# Patient Record
Sex: Male | Born: 1945 | Race: White | Hispanic: No | Marital: Married | Smoking: Never smoker
Health system: Southern US, Community
[De-identification: ages and names within clinical notes are randomized; demographics above are authoritative.]

## PROBLEM LIST (undated history)

## (undated) DIAGNOSIS — G4733 Obstructive sleep apnea (adult) (pediatric): Secondary | ICD-10-CM

## (undated) DIAGNOSIS — K219 Gastro-esophageal reflux disease without esophagitis: Secondary | ICD-10-CM

## (undated) DIAGNOSIS — I10 Essential (primary) hypertension: Secondary | ICD-10-CM

## (undated) DIAGNOSIS — E785 Hyperlipidemia, unspecified: Secondary | ICD-10-CM

## (undated) HISTORY — DX: Hyperlipidemia, unspecified: E78.5

## (undated) HISTORY — DX: Essential (primary) hypertension: I10

## (undated) HISTORY — PX: PROSTATE BIOPSY: SHX241

## (undated) HISTORY — DX: Obstructive sleep apnea (adult) (pediatric): G47.33

## (undated) HISTORY — PX: TONSILLECTOMY: SUR1361

---

## 2001-10-22 ENCOUNTER — Ambulatory Visit (HOSPITAL_COMMUNITY): Admission: RE | Admit: 2001-10-22 | Discharge: 2001-10-22 | Payer: Self-pay | Admitting: *Deleted

## 2007-02-26 ENCOUNTER — Ambulatory Visit: Payer: Self-pay | Admitting: Internal Medicine

## 2007-02-26 ENCOUNTER — Encounter: Admission: RE | Admit: 2007-02-26 | Discharge: 2007-02-26 | Payer: Self-pay | Admitting: Internal Medicine

## 2007-02-26 DIAGNOSIS — G4733 Obstructive sleep apnea (adult) (pediatric): Secondary | ICD-10-CM | POA: Insufficient documentation

## 2007-02-26 IMAGING — CR DG FOOT COMPLETE 3+V*R*
3 series · 3 of 3 positions shown · non-contrast
Comparison: none

CLINICAL DATA: Pain and numbness across third, fourth, and fifth metatarsals and toes.  No injury.  Patient is diabetic.
 RIGHT FOOT ? 3 VIEW:
 No acute abnormality is seen.  Alignment is normal.  No erosive process is noted.

[t foot ap right]
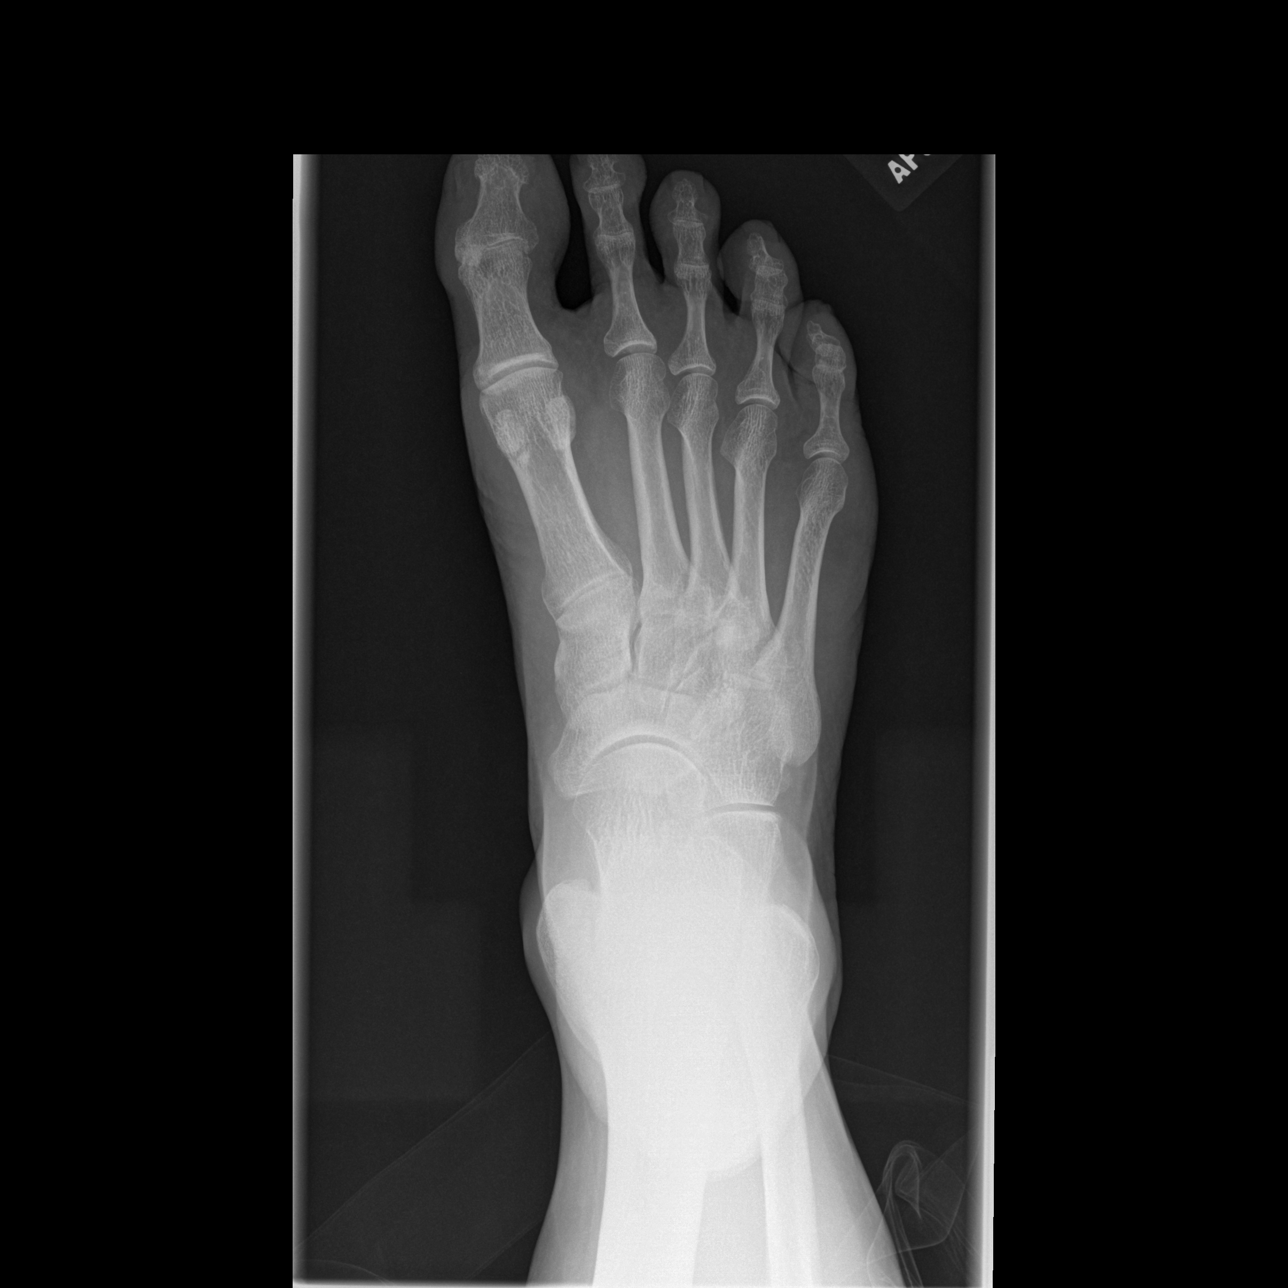

[t foot oblique right]
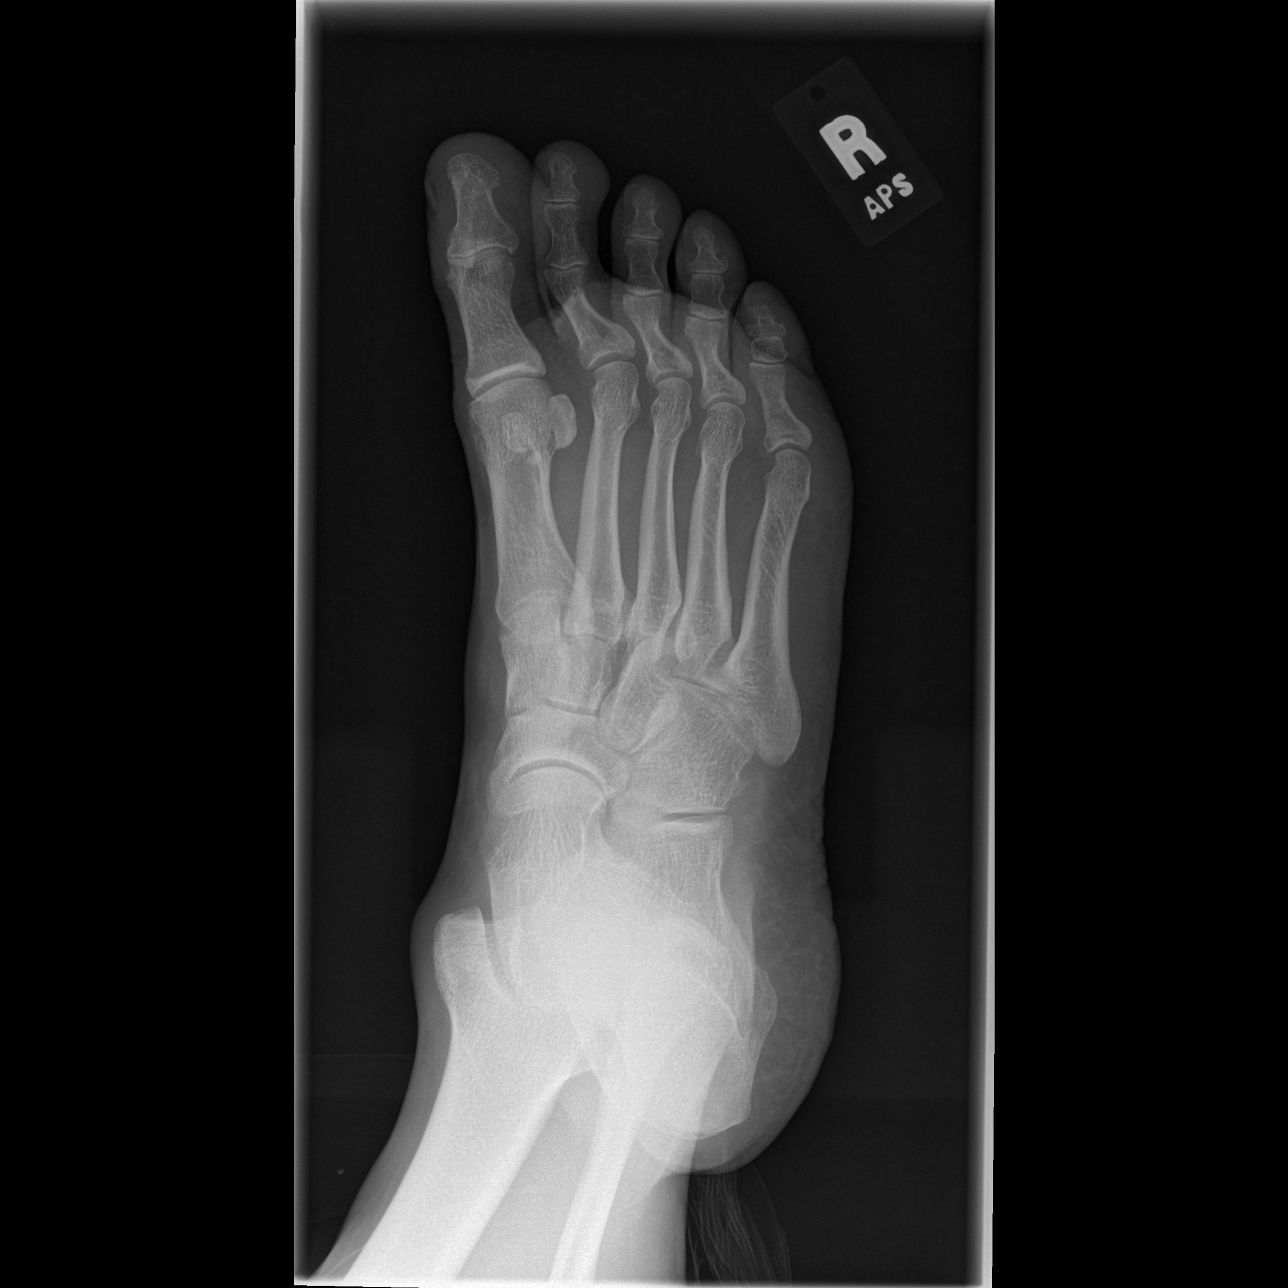

[t foot lat right]
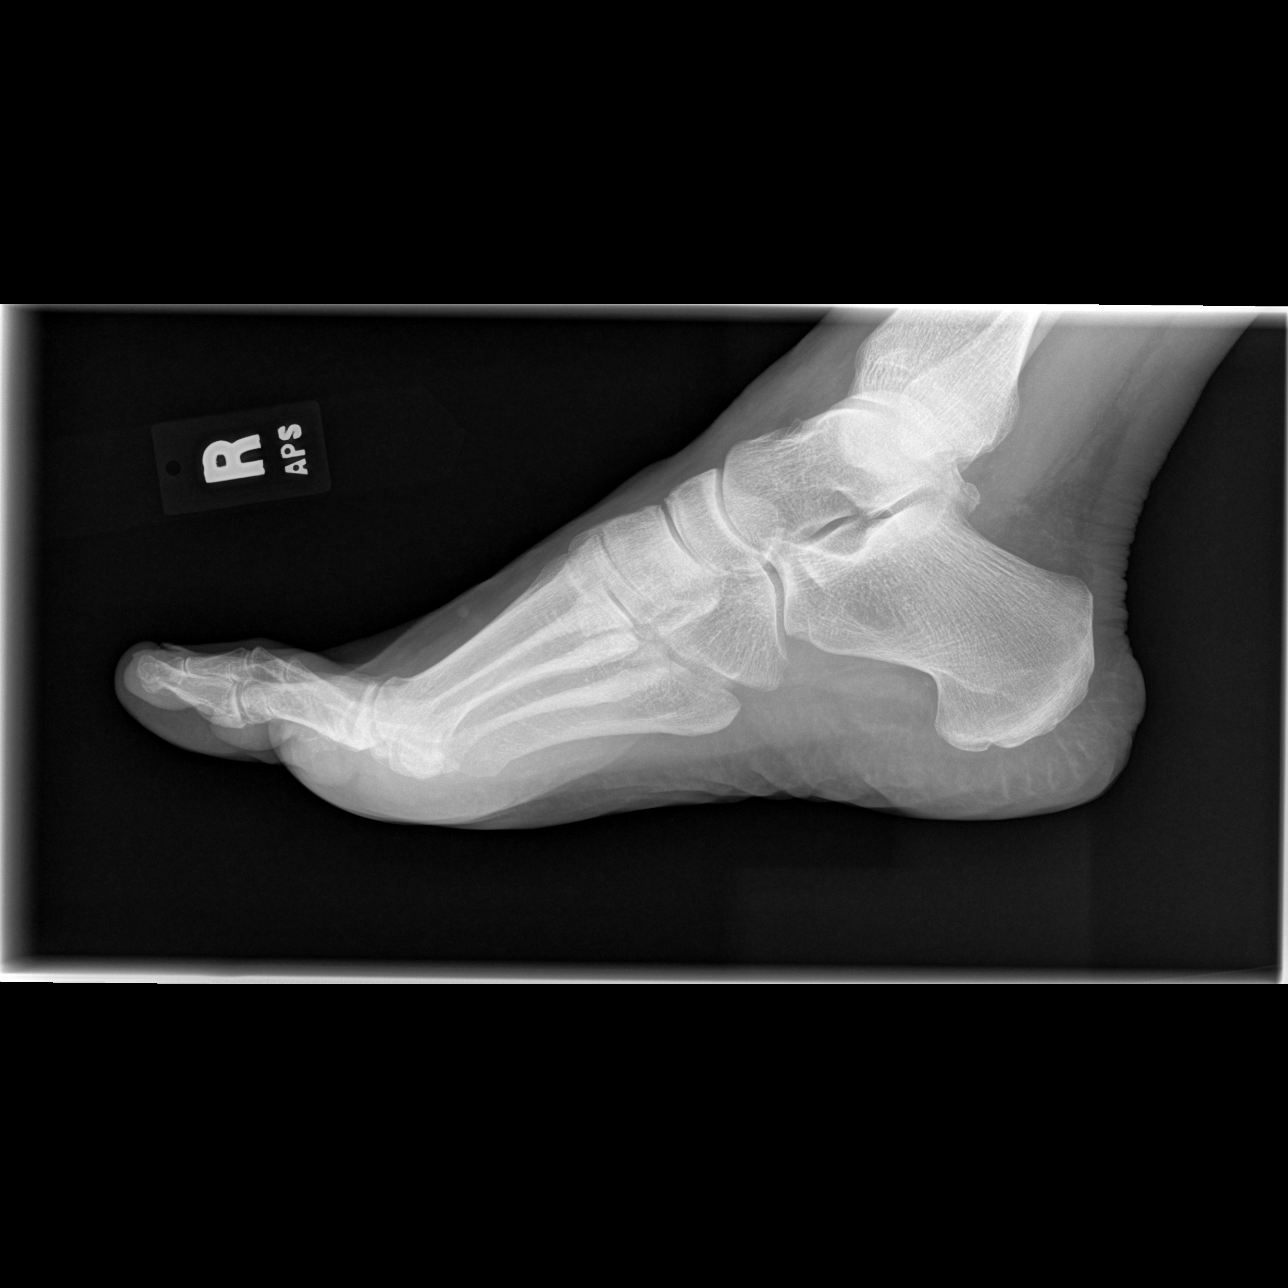

[3 of 3 positions shown; findings below may reference images not displayed]

IMPRESSION: Negative.

## 2007-03-28 ENCOUNTER — Encounter: Payer: Self-pay | Admitting: Internal Medicine

## 2007-03-28 ENCOUNTER — Ambulatory Visit (HOSPITAL_BASED_OUTPATIENT_CLINIC_OR_DEPARTMENT_OTHER): Admission: RE | Admit: 2007-03-28 | Discharge: 2007-03-28 | Payer: Self-pay | Admitting: Internal Medicine

## 2007-04-04 ENCOUNTER — Ambulatory Visit: Payer: Self-pay | Admitting: Internal Medicine

## 2007-04-15 ENCOUNTER — Ambulatory Visit: Payer: Self-pay | Admitting: Internal Medicine

## 2007-04-18 DIAGNOSIS — G4730 Sleep apnea, unspecified: Secondary | ICD-10-CM | POA: Insufficient documentation

## 2007-06-14 ENCOUNTER — Ambulatory Visit: Payer: Self-pay | Admitting: Internal Medicine

## 2007-06-15 DIAGNOSIS — J302 Other seasonal allergic rhinitis: Secondary | ICD-10-CM | POA: Insufficient documentation

## 2008-06-27 ENCOUNTER — Ambulatory Visit: Payer: Self-pay | Admitting: Internal Medicine

## 2008-07-06 ENCOUNTER — Telehealth (INDEPENDENT_AMBULATORY_CARE_PROVIDER_SITE_OTHER): Payer: Self-pay | Admitting: *Deleted

## 2008-07-12 ENCOUNTER — Telehealth (INDEPENDENT_AMBULATORY_CARE_PROVIDER_SITE_OTHER): Payer: Self-pay | Admitting: *Deleted

## 2008-07-13 ENCOUNTER — Encounter: Payer: Self-pay | Admitting: Internal Medicine

## 2009-07-23 ENCOUNTER — Ambulatory Visit: Payer: Self-pay | Admitting: Internal Medicine

## 2010-03-19 NOTE — Assessment & Plan Note (Signed)
Summary: 2 months/apc   PCP:   Zenaida Deed  Chief Complaint:  2 month follow up.  History of Present Illness: Current Problems:  SLEEP APNEA (ICD-780.57) HYPERSOMNIA, ASSOCIATED WITH SLEEP APNEA (ICD-780.53)   Mr. Durene Cal returns for follow-up of his sleep apnea.  He has gotten comfortable with CPAP now using it every night.  Pressure set at 15 CVP.  He is having some mild seasonal rhinitis  which he treats with Claritin during the daytime, and Benadryl at night.  This has also worked well.  He requests no changes and indicates he is sleeping better.     Past Medical History:    Reviewed history from 04/15/2007 and no changes required:       Tonsils out       Hyperliidemia       Sleep Apnea NPSG 03/28/07  AHI 38.1, Epworth 9/24, wt 248lbs, cpap 15   Family History:    Reviewed history from 02/26/2007 and no changes required:       Father snored loudly not dx'd osa- died CVA, smoker  Social History:    Reviewed history from 02/26/2007 and no changes required:       Patient never smoked.        Attorney and newly elected [name]    Review of Systems      See HPI   Vital Signs:  Patient Profile:   65 Years Old Male Weight:      249.25 pounds O2 Sat:      96 % O2 treatment:    Room Air Pulse rate:   72 / minute BP sitting:   126 / 72  (left arm) Cuff size:   large  Vitals Entered By: Reynaldo Minium CMA (June 14, 2007 10:32 AM)             Comments Medications reviewed with patient  ..................................................................Marland KitchenReynaldo Minium CMA  June 14, 2007 10:32 AM      Physical Exam  General:     normal appearance and healthy appearing.   Nose:     mild turbinate edema Neck:     no JVD.   Lungs:     clear bilaterally to auscultation and percussion Heart:     regular rhythm and normal rate.   Cervical Nodes:     no significant adenopathy     Impression & Recommendations:  Problem # 1:  SLEEP APNEA (ICD-780.57) Mr. Durene Cal  has made a good start on his CPAP.  Sleep hygiene is appropriate.  He is comfortable with his machine set at 15CWP.  He does not describe problems staying alert driving back and forth from [place] where he works.  Problem # 2:  ALLERGIC RHINITIS (ICD-477.9) will try sample of Astepro nasal spray for p.r.n. use. His updated medication list for this problem includes:    Claritin 10 Mg Tabs (Loratadine) .Marland Kitchen... 1 by mouth daily   Medications Added to Medication List This Visit: 1)  Cpap 15   Other Orders: Est. Patient Level II (09811)   Patient Instructions: 1)  Please schedule a follow-up appointment in 1 year. 2)  Call if questions or problems 3)  continue cpap at 15 cwp 4)  Consider trial Astepro nasal spray as an alternative to your oral antihistamines, 1-2 puffs, up to twice daily as needed.    ]

## 2010-03-19 NOTE — Assessment & Plan Note (Signed)
Summary: f/u 1 yr///kp   Primary Provider/Referring Provider:   Zenaida Deed  CC:  Follow up compliant with cpap averages 8 hrs per night  and allergy flair over the last 2 weeks.  History of Present Illness: History of Present Illness: 06/14/07-  Mr. Durene Cal returns for follow-up of his sleep apnea.  He has gotten comfortable with CPAP now using it every night.  Pressure set at 15 CwP.  He is having some mild seasonal rhinitis  which he treats with Claritin during the daytime, and Benadryl at night.  This has also worked well.  He requests no changes and indicates he is sleeping better.  06/27/08- OSA with hypersomnia, allergic rhinitis  Great compliance with cpap- used almost every night. Better rested and without snoring. Nasal mask. Still alot of fatigue. Trying to eliminate all caffeine on instruction of nutritionalist. Problem with driving home in afternoon sleepy. We emphasized safe driving as his responsibility.  Current Medications (verified):  1)  Crestor 10 Mg  Tabs (Rosuvastatin Calcium) .Marland Kitchen.. 1 By Mouth Daily 2)  Cpap  15 3)  Zyrtec Allergy 10 Mg Tabs (Cetirizine Hcl) .Marland Kitchen.. 1 Once Daily  July 23, 2009- OSA, allergic rhinitis Now living in [place]. Dropped off Nuvigil- not needed. If he gets up and exercises he stays awake through the day and does well. He is satisfied and compliant with CPAP at 15. The only times he has problem with  it is because of nasal congestion. 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    Vital Signs:  Patient profile:   65 year old male Height:      71 inches Weight:      252 pounds BMI:     35.27 O2 Sat:      95 % on Room air Pulse rate:   75 / minute BP sitting:   138 / 80  (left arm)  Vitals Entered By: Renold Genta RCP, LPN (July 24, 7827 2:42 PM)  O2 Flow:  Room air CC: Follow up compliant with cpap averages 8 hrs per night , allergy flair over the last 2 weeks Comments Medications reviewed with patient Renold Genta RCP, LPN  July 24, 5619 2:55 PM    Physical Exam  Additional Exam:  General: A/Ox3; pleasant and cooperative, NAD, brisk responses, deliberative consideration of options SKIN: no rash, lesions NODES: no lymphadenopathy HEENT: [place]/AT, EOM- WNL, Conjuctivae- clear, PERRLA, TM-WNL, Nose- clear, Throat- clear and wnl, Mallampait III- IV NECK: Supple w/ fair ROM, JVD- none, normal carotid impulses w/o bruits Thyroid- normal to palpation CHEST: Clear to P&A HEART: RRR, no m/g/r heard ABDOMEN:  ZOX:WRUE, nl pulses, no edema  NEURO: Grossly intact to observation      Impression & Recommendations:  Problem # 1:  HYPERSOMNIA, ASSOCIATED WITH SLEEP APNEA (ICD-780.53)  Good complance and control with CPAP.  We discussed replacement of equipment, timing and indications.  Problem # 2:  ALLERGIC RHINITIS (ICD-477.9)  Discussion and script for Astepro. Discussed and contrasted Afrin for occasional use. His updated medication list  for this problem includes:    Zyrtec Allergy 10 Mg Tabs (Cetirizine hcl) .Marland Kitchen... 1 once daily    Astepro 0.15 % Soln (Azelastine hcl) .Marland Kitchen... 1-2 sprays each nostril three times a day as needed  Medications Added to Medication List This Visit: 1)  Astepro 0.15 % Soln (Azelastine hcl) .Marland Kitchen.. 1-2 sprays each nostril three times a day as needed  Other Orders: Est. Patient Level III (45409)  Patient Instructions: 1)  Please schedule a follow-up appointment in 1 year. 2)  Sample and script for Astepro; 3)  1-2 sprays each nostril three times a day if needed 4)  For very occasional use, an otc decongestant like Afrin may work well, but need to avoid using it on a sustained regular basis.  5)  Continue CPAP at 15 Prescriptions: ASTEPRO 0.15 % SOLN (AZELASTINE HCL) 1-2 sprays each nostril three times a day as needed  #1 x prn   Entered and Authorized by:   Waymon Budge MD   Signed by:   Waymon Budge MD on 07/23/2009   Method used:   Print then Give to Patient   RxID:   [id number]

## 2010-03-19 NOTE — Assessment & Plan Note (Signed)
Summary: SNORING/ MBW   Visit Type:  Initial Consult PCP:  RonRoberts  Chief Complaint:  Sleep consult.  History of Present Illness: Consult for Dr. Zenaida Deed for c/o snoring    Loud snore, wife complains and  she notes apneas. 10 years ago it resolved with significant weight loss. Nonpositional. EDS at times if quiet. May have to pull over for nap with long drives. Caffeine may help: 2-3 caffeinated drinks/ day HS 8:30-9:00PM, short latency, used melatonin briefly once with benefit. Trying calcium magnesium suggested by friend to help "electrical shocks in legs" at night- doesn't affect wife- no complain from her that he kicks or strikes out. Up 6:30-7AM, feels rested after morning caffeine. Notes bags under eyes. Remote tonsillectomy, normal thyroid       Past Medical History:    Tonsils out    Hyperliidemia   Family History:    Father snored loudly not dx'd osa- died CVA, smoker  Social History:    Patient never smoked.     Attorney and newly elected Judge   Risk Factors:  Tobacco use:  never Alcohol use:  yes    Drinks per day:  <1    Has patient --       Felt need to cut down:  no       Been annoyed by complaints:  no       Felt guilty about drinking:  no       Needed eye opener in the morning:  no   Review of Systems  General      Complains of fatigue and sleep disorder.      Denies weight loss.  ENT      Complains of nasal congestion.  Resp      Complains of sleep disturbances due to breathing.   Vital Signs:  Patient Profile:   65 Years Old Male Weight:      255 pounds O2 Sat:      95 % O2 treatment:    Room Air Pulse rate:   75 / minute BP sitting:   122 / 90  (right arm)  Vitals Entered By: Michel Bickers CMA (February 26, 2007 3:55 PM)                 Physical Exam  General:     well developed, well nourished, in no acute distress, obese. Alert and appropriate  Head:     normocephalic and atraumatic Eyes:     periorbital  edema Nose:     mild turbinate edema Mouth:     Melampatti Class III.   Neck:     no masses, thyromegaly, or abnormal cervical nodes Chest Wall:     no deformities noted Lungs:     clear bilaterally to auscultation and percussion Heart:     regular rate and rhythm, S1, S2 without murmurs, rubs, gallops, or clicks     Impression & Recommendations:  Problem # 1:  HYPERSOMNIA, ASSOCIATED WITH SLEEP APNEA (ICD-780.53) osa for npsg and rtc for f/u Orders: Est. Patient Level IV (16109) schedule split protocol npsg to r/o hypersomnia with sleep apnea   Medications Added to Medication List This Visit: 1)  Crestor 10 Mg Tabs (Rosuvastatin calcium) .Marland Kitchen.. 1 by mouth daily 2)  Claritin 10 Mg Tabs (Loratadine) .Marland Kitchen.. 1 by mouth daily  Other Orders: Sleep Disorder Referral (Sleep Disorder)   Patient Instructions: 1)  Please schedule a follow-up appointment in 2 months. 2)  follow up with Dr Maple Hudson  after sleep study read    ]

## 2010-03-19 NOTE — Progress Notes (Signed)
Summary: LMTCB-reaction to Nuvigil 250  Phone Note Call from Patient Call back at [facility] Phone [phone]   Caller: Patient Call For: young Reason for Call: Talk to Nurse Summary of Call: having trouble w/ Nuvigil, can't sleep, was up for 24 hours straight, couldn't go to sleep, excited thoughts, kind of manicreaction to it.  Had a lot of energy and after a while it wasn't good at all.  He's in [place].  He'll be avaiable on it after 3:00 our time. Initial call taken by: Eugene Gavia,  Jul 06, 2008 2:09 PM  Follow-up for Phone Call        Best we can do is stop Nuvigil and wait for it to clear. This drug is less likely to cause over stimulation than anything else except caffeine. I'm sorry it didn't work . We could try much lower doses- fragments of his current samples, but woud advise this be done at home on weekend. Follow-up by: Waymon Budge MD,  Jul 06, 2008 11:34 PM  Additional Follow-up for Phone Call Additional follow up Details #1::        Attempted to contact pt.  LMOM TCB. Cloyde Reams RN  Jul 07, 2008 9:27 AM   Pt returned call, needs call before 3:45, he'll be on a plane at 3:45 Additional Follow-up by: Eugene Gavia,  Jul 07, 2008 1:25 PM    Additional Follow-up for Phone Call Additional follow up Details #2::    spoke to pt and he wanted to try a lower dose,  per dr young we can call in nuvigil 50mg  1 by mouth once daily  rx called in for pt gave only 10 tabs pt only wanted a few to see how it worked for him Follow-up by: Philipp Deputy CMA,  Jul 07, 2008 2:22 PM  New/Updated Medications: NUVIGIL 50 MG TABS (ARMODAFINIL) 1 by mouth once daily   Prescriptions: NUVIGIL 50 MG TABS (ARMODAFINIL) 1 by mouth once daily  #10 x 0   Entered by:   Philipp Deputy CMA   Authorized by:   Waymon Budge MD   Signed by:   Philipp Deputy CMA on 07/07/2008   Method used:   Telephoned to ...         RxID:   [id number]  [facility] [place], [place] [facility] [address] ph# [phone],  this was per pt's request

## 2010-03-19 NOTE — Medication Information (Signed)
Summary: Tax adviser   Imported By: Valinda Hoar 07/13/2008 08:44:04  _____________________________________________________________________  External Attachment:    Type:   Image     Comment:   External Document

## 2010-03-19 NOTE — Assessment & Plan Note (Signed)
Summary: 2 WEEKS AFTER SLEEP STUDY/APC   Visit Type:  Follow-up PCP:   Zenaida Deed  Chief Complaint:  Pt here for rov to discuss the results of his psg.  No new complaints today.Marland Kitchen  History of Present Illness: Current Problems:  HYPERSOMNIA, ASSOCIATED WITH SLEEP APNEA (ICD-780.53)  Returns for f/u after NPSG confirmed AHI 38.1/hr. Physiology, medical concerns and treatments for sleep apnea hve been reviewed. CPAP had titrated to 15 cwp and he is going to try that. He now livesw and works in [place], but says he prefers to continue coming here for care.        Past Medical History:    Reviewed history from 02/26/2007 and no changes required:       Tonsils out       Hyperliidemia       Sleep Apnea NPSG 03/28/07  AHI 38.1, Epworth 9/24, wt 248lbs, cpap 15   Family History:    Reviewed history from 02/26/2007 and no changes required:       Father snored loudly not dx'd osa- died CVA, smoker  Social History:    Reviewed history from 02/26/2007 and no changes required:       Patient never smoked.        Attorney and newly elected [name]    Review of Systems      See HPI   Vital Signs:  Patient Profile:   65 Years Old Male Weight:      254.13 pounds O2 Sat:      96 % O2 treatment:    Room Air Pulse rate:   77 / minute BP sitting:   138 / 86  (left arm)  Vitals Entered By: Vernie Murders (April 15, 2007 2:48 PM)                 Physical Exam  General:     normal appearance, healthy appearing, and obese.   Head:     normocephalic and atraumatic Eyes:     PERRLA/EOM intact; conjunctiva and sclera clear Ears:     TMs intact and clear with normal canals Nose:     no deformity, discharge, inflammation, or lesions Neck:     no masses, thyromegaly, or abnormal cervical nodesno JVD.   Lungs:     clear bilaterally to auscultation and percussion Heart:     regular rate and rhythm, S1, S2 without murmurs, rubs, gallops, or clicks Neurologic:     Grossly normal  to observation     Impression & Recommendations:  Problem # 1:  HYPERSOMNIA, ASSOCIATED WITH SLEEP APNEA (ICD-780.53) Assessment: Unchanged Appropriate for trial of CPAP as discussed. Orders: Est. Patient Level III (19147) DME Referral (DME)    Patient Instructions: 1)  Please schedule a follow-up appointment in 2 months. 2)  We are starting cpap at 15 cwp    ]

## 2010-03-19 NOTE — Letter (Signed)
Summary: Sleep consultation form  Sleep consultation form   Imported By: Maryln Gottron 03/05/2007 13:56:00  _____________________________________________________________________  External Attachment:    Type:   Image     Comment:   External Document

## 2010-03-19 NOTE — Assessment & Plan Note (Signed)
Summary: follow up/ mbw   Primary Provider/Referring Provider:   Zenaida Deed  CC:  1 year follow up compliant with cpap averages 7 hrs per night .  History of Present Illness: 06/14/07-  [name] returns for follow-up of his sleep apnea.  He has gotten comfortable with CPAP now using it every night.  Pressure set at 15 CwP.  He is having some mild seasonal rhinitis  which he treats with Claritin during the daytime, and Benadryl at night.  This has also worked well.  He requests no changes and indicates he is sleeping better.  06/27/08- OSA with hypersomnia, allergic rhinitis  Great compliance with cpap- used almost every night. Better rested and without snoring. Nasal mask. Still alot of fatigue. Trying to eliminate all caffeine on instruction of nutritionalist. Problem with driving home in afternoon sleepy. We emphasized safe driving as his responsibility.  Current Medications (verified): 1)  Crestor 10 Mg  Tabs (Rosuvastatin Calcium) .Marland Kitchen.. 1 By Mouth Daily 2)  Cpap  15 3)  Zyrtec Allergy 10 Mg Tabs (Cetirizine Hcl) .Marland Kitchen.. 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  Past Medical History:    Reviewed history from 04/15/2007 and no changes required:    Tonsils out    Hyperliidemia    Sleep Apnea NPSG 03/28/07  AHI 38.1,  Epworth 9/24, wt 248lbs, cpap 15  Past Surgical History:    Tonsillectomy  Family History:    Reviewed history from 2007-02-28 and no changes required:       Father snored loudly not dx'd osa- died CVA, smoker  Social History:    Reviewed history from 28-Feb-2007 and no changes required:       Patient never smoked.        Attorney and newly elected [name]  Review of Systems      See HPI       Denies headache, sinus drainage, sneezing chest pain, dyspnea, n/v/d, weight loss, fever, edema.    Vital Signs:  Patient profile:   65 year old male Height:      71 inches Weight:      257 pounds BMI:     35.97 O2 Sat:      94 % Pulse rate:   67 / minute BP sitting:   120 / 74  (left arm)  Vitals Entered By: Renold Genta RCP, LPN (Jun 27, 2008 4:18 PM)  O2 Sat on room air at rest %:  94 CC: 1 year follow up compliant with cpap averages 7 hrs per night  Comments Medications reviewed with patient Renold Genta RCP, LPN  Jun 27, 2008 4:21 PM    Physical Exam  Additional Exam:  General:  A/Ox3; pleasant and cooperative, NAD, brisk responses, deliberative consideration of options SKIN: no rash, lesions NODES: no lymphadenopathy HEENT: [place]/AT, EOM- WNL, Conjuctivae- clear, PERRLA, TM-WNL, Nose- clear, Throat- clear and wnl, Melampatti III NECK: Supple w/ fair ROM, JVD- none, normal carotid impulses w/o bruits Thyroid- normal to palpation CHEST: Clear to P&A HEART: RRR, no m/g/r heard ABDOMEN:  ZOX:WRUE, nl pulses, no edema  NEURO: Grossly intact to observation      Impression & Recommendations:  Problem # 1:  HYPERSOMNIA, ASSOCIATED WITH SLEEP APNEA (ICD-780.53)  Obstructive apnea seems well controlled with excellent cpap compliance, not snoring through cpap per wife, not wakeful at night and good sleep hygiene. Still with residual sleepiness in afternoon. Discussed caffeine and naps. Will try Nuvigil which we discussed in comparison to alternativel like ritalin. Will try  sample and script for Nuvigil. [facility] drug reactions reviewed with him.  Medications Added to Medication List This Visit: 1)  Zyrtec Allergy 10 Mg Tabs (Cetirizine hcl) .Marland Kitchen.. 1 once daily 2)  Nuvigil 250 Mg Tabs (Armodafinil) .Marland Kitchen.. 1 daily as needed  Other Orders: Est. Patient Level III (45409)  Patient Instructions: 1)  Please schedule a follow-up appointment in 1 year. 2)  Try samples and script Nuvigil 250 mg, one daily as needed. Prescriptions: NUVIGIL 250 MG TABS (ARMODAFINIL) 1 daily as needed  #30 x prn   Entered and Authorized by:   Waymon Budge MD   Signed by:   Waymon Budge MD on 06/27/2008   Method used:   Print then Give to Patient   RxID:   [id number]

## 2010-03-19 NOTE — Progress Notes (Signed)
Summary: preauth- nuvigil  Phone Note Call from Patient Call back at Home Phone [phone]   Caller: Patient Call For: young Summary of Call: pt states that [facility] [address] in [place] has faxed [place] "twice" and waiting for a response to request of pre-auth for nuvigil. pt is requesting that a nurse call this [facility] at [phone]. Initial call taken by: Tivis Ringer,  Jul 12, 2008 9:20 AM  Follow-up for Phone Call        Called for PA- med approved x 1 year starting today.  Faxed this back to pharm to make them aware and lmom for pt to be made aware. Follow-up by: Vernie Murders,  Jul 12, 2008 10:10 AM

## 2010-07-02 NOTE — Procedures (Signed)
NAME:  Harold Vargas, Harold Vargas NO.:  [id number]   MEDICAL RECORD NO.:  [id number]          PATIENT TYPE:  OUT   LOCATION:  SLEEP CENTER                 [facility]ACILITY[facility]   PHYSICIAN:  Clinton D. Maple Hudson, MD, FCCP, FACPDATE OF BIRTH:  15-Dec[birth year]1945   DATE OF STUDY:  03/28/2007                            NOCTURNAL POLYSOMNOGRAM   REFERRING PHYSICIAN:  Clinton D. Young, MD, FCCP, FACP   INDICATION FOR STUDY:  Hypersomnia with sleep apnea.   EPWORTH SLEEPINESS SCORE:  9/24, BMI 34.6, weight 248 pounds, height 71  inches, neck 18 inches.   HOME MEDICATIONS:  Charted and reviewed.   SLEEP ARCHITECTURE:  Split study protocol.  During the diagnostic phase,  total sleep time was 118 minutes with sleep efficiency 79.7%.  Stage I  was 11%, stage II 82.6%, stage III absent, REM 6.4% of total sleep time.  Sleep latency was 27.5 minutes.  REM latency 63.5 minutes.  Awake after  sleep onset 2.5 minutes.  Arousal index increased at 52.4.  Bedtime  medication Crestor and Claritin.   RESPIRATORY DATA:  Split study protocol.  Apnea/hypopnea index (AHI)  38.1 obstructive events per hour indicating moderately severe  obstructive sleep apnea/hypopnea syndrome before CPAP.  This included 64  obstructive apneas and 11 hypopneas before CPAP.  Most events were  recorded while sleeping supine.  CPAP was titrated to 15-CWP.  AHI 0.5  per hour.  A large Mirage Quattro mask was used with heated humidifier.   OXYGEN DATA:  Moderately loud snoring with oxygen desaturation to a  nadir of 71% before CPAP.  After CPAP control, oxygen saturation held  94% on room air.   CARDIAC DATA:  Normal sinus rhythm.   MOVEMENT-PARASOMNIA:  No significant movement disturbance.  Bathroom x1.   IMPRESSIONS-RECOMMENDATIONS:  1. Moderately severe obstructive sleep apnea/hypopnea syndrome,      apnea/hypopnea index 38.1 per hour with most event recorded while      sleeping supine, moderately loud snoring with oxygen  desaturation      to a nadir of 71%.  2. Successful CPAP titration to 15-CWP, apnea/hypopnea index 0.5 per      hour.  A large Mirage Quattro mask was used with heated humidifier.      Clinton D. Maple Hudson, MD, [facility], FACP  Diplomate, Biomedical engineer of Sleep Medicine  Electronically Signed     CDY/MEDQ  D:  04/04/2007 09:20:25  T:  04/05/2007 16:15:07  Job:  [id number]

## 2010-07-11 ENCOUNTER — Encounter: Payer: Self-pay | Admitting: Internal Medicine

## 2010-07-22 ENCOUNTER — Ambulatory Visit: Payer: Self-pay | Admitting: Internal Medicine

## 2010-08-29 ENCOUNTER — Ambulatory Visit (INDEPENDENT_AMBULATORY_CARE_PROVIDER_SITE_OTHER): Payer: BC Managed Care – PPO | Admitting: Internal Medicine

## 2010-08-29 ENCOUNTER — Encounter: Payer: Self-pay | Admitting: Internal Medicine

## 2010-08-29 DIAGNOSIS — G4710 Hypersomnia, unspecified: Secondary | ICD-10-CM

## 2010-08-29 DIAGNOSIS — G4730 Sleep apnea, unspecified: Secondary | ICD-10-CM

## 2010-08-29 DIAGNOSIS — J309 Allergic rhinitis, unspecified: Secondary | ICD-10-CM

## 2010-08-29 NOTE — Progress Notes (Signed)
  Subjective:    Patient ID: Harold Vargas, male    DOB: 06/09/1945, 65 y.o.   MRN: [id number]  HPI 08/29/10- 65 yoM followed for OSA, hypersomnia, complicated by allergic rhinitis Last here July 23, 2009- note reviewed He feels he is doing well and doing well with CPAP, sleeping 8-10 hours every night with an occasional nap. Nasal mask. He has question about replacing parts of the mask. Still some drowsy in afternoons 2-4 PM. Going back to school and concerned about being able to stay awake.Leona Carry gave a boost so he is thinking about restarting it, but wanted to discuss it with me  because of package information cautions in context of OSA . Nuvigil 250 overstimulated him before. Caffeine didn't help. Iced tea gives watery nose.   Review of Systems Constitutional:   No-   weight loss, night sweats, fevers, chills, fatigue, lassitude. HEENT:   No-   headaches, difficulty swallowing, tooth/dental problems, sore throat,                  No-   sneezing, itching, ear ache, nasal congestion, post nasal drip,   CV:  No-   chest pain, orthopnea, PND, swelling in lower extremities, anasarca, dizziness, palpitations  GI:  No-   heartburn, indigestion, abdominal pain, nausea, vomiting, diarrhea,                 change in bowel habits, loss of appetite  Resp: No-   shortness of breath with exertion or at rest.  No-  excess mucus,             No-   productive cough,  No non-productive cough,  No-  coughing up of blood.              No-   change in color of mucus.  No- wheezing.    Skin: No-   rash or lesions.  GU: No-   dysuria, change in color of urine, no urgency or frequency.  No- flank pain.  MS:  No-   joint pain or swelling.  No- decreased range of motion.  No- back pain.  Psych:  No- change in mood or affect. No depression or anxiety.  No memory loss.      Objective:   Physical Exam General- Alert, Oriented, Affect-appropriate, Distress- none acute  Overweight, intelligent man  Skin-  rash-none, lesions- none, excoriation- none  Lymphadenopathy- none  Head- atraumatic  Eyes- Gross vision intact, PERRLA, conjunctivae clear secretions  Ears- Hearing, canals, Tm- normal  Nose- Clear, No- Septal dev, mucus, polyps, erosion, perforation   Throat- Mallampati IV , mucosa clear , drainage- none, tonsils- atrophic  Neck- flexible , trachea midline, no stridor , thyroid nl, carotid no bruit  Chest - symmetrical excursion , unlabored     Heart/CV- RRR , no murmur , no gallop  , no rub, nl s1 s2                     - JVD- none , edema- none, stasis changes- none, varices- none     Lung- clear to P&A, wheeze- none, cough- none , dullness-none, rub- none     Chest wall-   Abd- tender-no, distended-no, bowel sounds-present, HSM- no  Br/ Gen/ Rectal- Not done, not indicated  Extrem- cyanosis- none, clubbing, none, atrophy- none, strength- nl  Neuro- grossly intact to observation         Assessment & Plan:

## 2010-08-29 NOTE — Patient Instructions (Signed)
Try [employer] as a source of mask replacement parts.  Try allegra/ fexofenadine as a potentially less sedating antihistamine than Zyrtec  Continue CPAP at 15  Your Androgel should not be a problem.

## 2010-08-29 NOTE — Assessment & Plan Note (Signed)
He may be experiencing some vasomotor rhinitis. We can try ipratropium later if needed. I suggested he try allegra as less sedating than the zyrtec he has been using.

## 2010-08-29 NOTE — Assessment & Plan Note (Addendum)
Good CPAP compliance and control at 15. We discussed resources for mask components. DME is in [place] He may need to try low dose Nuvigil, but ok to try his Androgel first since he feels it gives him more alertness.

## 2011-08-29 ENCOUNTER — Ambulatory Visit: Payer: BC Managed Care – PPO | Admitting: Internal Medicine

## 2011-10-07 ENCOUNTER — Encounter: Payer: Self-pay | Admitting: Internal Medicine

## 2011-10-07 ENCOUNTER — Ambulatory Visit (INDEPENDENT_AMBULATORY_CARE_PROVIDER_SITE_OTHER): Payer: BC Managed Care – PPO | Admitting: Internal Medicine

## 2011-10-07 VITALS — BP 132/88 | HR 79 | Ht 71.0 in | Wt 256.0 lb

## 2011-10-07 DIAGNOSIS — G4710 Hypersomnia, unspecified: Secondary | ICD-10-CM

## 2011-10-07 DIAGNOSIS — J309 Allergic rhinitis, unspecified: Secondary | ICD-10-CM

## 2011-10-07 DIAGNOSIS — G4730 Sleep apnea, unspecified: Secondary | ICD-10-CM

## 2011-10-07 NOTE — Progress Notes (Signed)
Subjective:    Patient ID: Harold Vargas, male    DOB: 1945/03/31, 66 y.o.   MRN: [id number]  HPI 08/29/10- 65 yoM followed for OSA, hypersomnia, complicated by allergic rhinitis Last here July 23, 2009- note reviewed He feels he is doing well and doing well with CPAP, sleeping 8-10 hours every night with an occasional nap. Nasal mask. He has question about replacing parts of the mask. Still some drowsy in afternoons 2-4 PM. Going back to school and concerned about being able to stay awake.Leona Carry gave a boost so he is thinking about restarting it, but wanted to discuss it with me  because of package information cautions in context of OSA . Nuvigil 250 overstimulated him before. Caffeine didn't help. Iced tea gives watery nose.   10/07/11- 65 yoM followed for OSA, hypersomnia, complicated by allergic rhinitis Wears CPAP every night unless having a head cold; wears approx 6 or more hours; pressure working well for patient. Uses a DME provider in [place]. Still travels back to [facility] for other medical care. Says alertness okay. Working on his dissertation for PhD. 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 Wants to try Provent nasal valves as an alternative to CPAP. We discussed this. Had prolonged nasal drip after upper respiratory infection. Nasal steroid spray and saline rinse worked. 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  ROS-see HPI Constitutional:   No-   weight loss, night sweats, fevers, chills, no-fatigue, lassitude. HEENT:   No-  headaches, difficulty swallowing, tooth/dental problems, sore throat,       No-  sneezing, itching, ear ache, nasal congestion, post nasal drip,  CV:  No-   chest pain, orthopnea, PND, swelling in lower extremities, anasarca,  dizziness, palpitations Resp: No-   shortness of breath with exertion or at rest.              No-   productive cough,  No non-productive cough,  No- coughing up of blood.              No-   change in color of mucus.  No-  wheezing.   Skin: No-   rash or lesions. GI:  No-   heartburn, indigestion, abdominal pain, nausea, vomiting,  GU:  MS:  No-   joint pain or swelling.  . Neuro-     nothing unusual Psych:  No- change in mood or affect. No depression or anxiety.  No memory loss.  OBJ- Physical Exam General- Alert, Oriented, Affect-appropriate, Distress- none acute, overweight Skin- rash-none, lesions- none, excoriation- none Lymphadenopathy- none Head- atraumatic            Eyes- Gross vision intact, PERRLA, conjunctivae and secretions clear            Ears- Hearing, canals-normal            Nose- + mild turbinate edema, no-Septal dev, mucus, polyps, erosion, perforation             Throat- Mallampati II , mucosa clear , drainage- none, tonsils- atrophic Neck- flexible , trachea midline, no stridor , thyroid nl, carotid no bruit Chest - symmetrical excursion , unlabored           Heart/CV- RRR , no murmur , no gallop  , no rub, nl s1 s2                           - JVD- none , edema- none, stasis changes- none, varices- none  Lung- clear to P&A, wheeze- none, cough+ light , dullness-none, rub- none           Chest wall-  Abd-  Br/ Gen/ Rectal- Not done, not indicated Extrem- cyanosis- none, clubbing, none, atrophy- none, strength- nl Neuro- grossly intact to observation  Assessment & Plan:

## 2011-10-07 NOTE — Patient Instructions (Addendum)
Script given for Provent nasal valves to try for sleep apnea when CPAP not available.  Continue CPAP 15 as your primary treatment  Please call as needed

## 2011-10-14 NOTE — Assessment & Plan Note (Signed)
Good compliance and control with CPAP. Weight loss would help. I gave him handwritten prescription for Provent nasal valves and will be interested in his experience.

## 2011-10-14 NOTE — Assessment & Plan Note (Signed)
Delayed clearing after what sounds like an upper respiratory viral infection. Otherwise he has done well with his nasal steroid spray and saline rinse.

## 2011-12-04 ENCOUNTER — Emergency Department (HOSPITAL_COMMUNITY)
Admission: EM | Admit: 2011-12-04 | Discharge: 2011-12-04 | Discharge status: Against Medical Advice | Payer: BC Managed Care – PPO | Source: Home / Self Care | Attending: Emergency Medicine | Admitting: Emergency Medicine

## 2011-12-04 ENCOUNTER — Encounter (HOSPITAL_COMMUNITY): Payer: Self-pay | Admitting: Emergency Medicine

## 2011-12-04 NOTE — ED Notes (Signed)
Pt left without being treated. Mw,cma

## 2011-12-04 NOTE — ED Notes (Signed)
Pt c/o bruise on left foot that appeared this a.m. Pt denies injury and pain.

## 2012-06-07 ENCOUNTER — Encounter: Payer: Self-pay | Admitting: *Deleted

## 2012-10-07 ENCOUNTER — Ambulatory Visit: Payer: BC Managed Care – PPO | Admitting: Internal Medicine

## 2012-10-11 ENCOUNTER — Encounter: Payer: Self-pay | Admitting: Internal Medicine

## 2012-10-11 ENCOUNTER — Ambulatory Visit (INDEPENDENT_AMBULATORY_CARE_PROVIDER_SITE_OTHER): Payer: BC Managed Care – PPO | Admitting: Internal Medicine

## 2012-10-11 VITALS — BP 120/70 | HR 82 | Ht 71.0 in | Wt 255.8 lb

## 2012-10-11 DIAGNOSIS — G4730 Sleep apnea, unspecified: Secondary | ICD-10-CM

## 2012-10-11 DIAGNOSIS — G4710 Hypersomnia, unspecified: Secondary | ICD-10-CM

## 2012-10-11 DIAGNOSIS — G4733 Obstructive sleep apnea (adult) (pediatric): Secondary | ICD-10-CM

## 2012-10-11 MED ORDER — ARMODAFINIL 50 MG PO TABS: ORAL_TABLET | ORAL | Status: DC

## 2012-10-11 MED ORDER — ARMODAFINIL 150 MG PO TABS: 150.0000 mg | ORAL_TABLET | Freq: Every day | ORAL | Status: DC

## 2012-10-11 NOTE — Patient Instructions (Addendum)
Sample Nuvigil 150     1/2- 1 in morning as needed  Script for Nuvigil 50 mg. The adult dose is usually is 150- 250 mg daily, so you can use up to that much as needed  Script printed- DME Replacement CPAP machine 15 cwp, humidiifer, mask of choice, supplies, dx OSA     To carry to the DME in [facility]  Please call as needed

## 2012-10-11 NOTE — Progress Notes (Signed)
Subjective:    Patient ID: Harold Vargas, male    DOB: 23-Jul-1945, 67 y.o.   MRN: [id number]  HPI 08/29/10- 65 yoM followed for OSA, hypersomnia, complicated by allergic rhinitis Last here July 23, 2009- note reviewed He feels he is doing well and doing well with CPAP, sleeping 8-10 hours every night with an occasional nap. Nasal mask. He has question about replacing parts of the mask. Still some drowsy in afternoons 2-4 PM. Going back to school and concerned about being able to stay awake.Leona Carry gave a boost so he is thinking about restarting it, but wanted to discuss it with me  because of package information cautions in context of OSA . Nuvigil 250 overstimulated him before. Caffeine didn't help. Iced tea gives watery nose.   10/07/11- 65 yoM followed for OSA, hypersomnia, complicated by allergic rhinitis Wears CPAP every night unless having a head cold; wears approx 6 or more hours; pressure working well for patient. Uses a DME provider in [place]. Still travels back to [facility] for other medical care. Says alertness okay. Working on his dissertation for PhD. 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 Wants to try Provent nasal valves as an alternative to CPAP. We discussed this. Had prolonged nasal drip after upper respiratory infection. Nasal steroid spray and saline rinse worked. 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 Drives over from [facility] for: Sleep pattern has changed - Waking up earlier - Going to bed earlier - Cpap sometimes cut short due to nasal congestion - increased tiredness in afternoons and diff concentrating 250 mg Nuvigil was too strong but he would like to be able to break a 150 mg Nuvigi if needed. He has a driver, so will sleep in his car if he needs to. CPAP 15 is comfortable/DME company in [place]. Old machine is getting noisy and bothers his  wife.  ROS-see HPI Constitutional:   No-   weight loss, night sweats, fevers, chills, +fatigue, no-lassitude. HEENT:   No-  headaches, difficulty swallowing, tooth/dental problems, sore throat,       No-  sneezing, itching, ear ache, nasal congestion, post nasal drip,  CV:  No-   chest pain, orthopnea, PND, swelling in lower extremities, anasarca,  dizziness, palpitations Resp: No-   shortness of breath with exertion or at rest.              No-   productive cough,  No non-productive cough,  No- coughing up of blood.              No-   change in color of mucus.  No- wheezing.   Skin: No-   rash or lesions. GI:  No-   heartburn, indigestion, abdominal pain, nausea, vomiting,  GU:  MS:  No-   joint pain or swelling.  . Neuro-     nothing unusual Psych:  No- change in mood or affect. No depression or anxiety.  No memory loss.  OBJ- Physical Exam General- Alert, Oriented, Affect-appropriate, Distress- none acute, overweight Skin- rash-none, lesions- none, excoriation- none Lymphadenopathy- none Head- atraumatic            Eyes- Gross vision intact, PERRLA, conjunctivae and secretions clear            Ears- Hearing, canals-normal            Nose- + mild turbinate edema, no-Septal dev, mucus, polyps, erosion, perforation  Throat- Mallampati II , mucosa clear , drainage- none, tonsils- atrophic Neck- flexible , trachea midline, no stridor , thyroid nl, carotid no bruit Chest - symmetrical excursion , unlabored           Heart/CV- RRR , no murmur , no gallop  , no rub, nl s1 s2                           - JVD- none , edema- none, stasis changes- none, varices- none           Lung- clear to P&A, wheeze- none, cough+ light , dullness-none, rub- none           Chest wall-  Abd-  Br/ Gen/ Rectal- Not done, not indicated Extrem- cyanosis- none, clubbing, none, atrophy- none, strength- nl Neuro- grossly intact to observation  Assessment & Plan:

## 2012-10-23 NOTE — Assessment & Plan Note (Signed)
CPAP 15 is comfortable and his compliance is good. Fractions of a Nuvigil pill have helped. Plan-try providing Nuvigil 50 mg. Look into replacing worn out machine.

## 2013-10-11 ENCOUNTER — Ambulatory Visit (INDEPENDENT_AMBULATORY_CARE_PROVIDER_SITE_OTHER): Payer: BC Managed Care – PPO | Admitting: Internal Medicine

## 2013-10-11 ENCOUNTER — Encounter: Payer: Self-pay | Admitting: Internal Medicine

## 2013-10-11 VITALS — BP 134/76 | HR 71 | Ht 71.0 in | Wt 265.4 lb

## 2013-10-11 DIAGNOSIS — J302 Other seasonal allergic rhinitis: Secondary | ICD-10-CM

## 2013-10-11 DIAGNOSIS — G4733 Obstructive sleep apnea (adult) (pediatric): Secondary | ICD-10-CM

## 2013-10-11 DIAGNOSIS — J309 Allergic rhinitis, unspecified: Secondary | ICD-10-CM

## 2013-10-11 DIAGNOSIS — G4730 Sleep apnea, unspecified: Secondary | ICD-10-CM

## 2013-10-11 DIAGNOSIS — G4710 Hypersomnia, unspecified: Secondary | ICD-10-CM

## 2013-10-11 NOTE — Assessment & Plan Note (Signed)
Adequate control with over-the-counter meds. This is not a peak season.

## 2013-10-11 NOTE — Patient Instructions (Signed)
We will continue CPAP 15/ [facility]/ [place] [phone]  Order- CPAP download pressure compliance documentation  Please call if we can help

## 2013-10-11 NOTE — Assessment & Plan Note (Signed)
He looks well and indicates good compliance and control. CPAP has helped his quality of life and he is satisfied to continue. Plan-download for pressure and compliance check

## 2013-10-11 NOTE — Progress Notes (Signed)
Subjective:    Patient ID: Harold Vargas, male    DOB: 1945/12/19, 68 y.o.   MRN: [id number]  HPI 08/29/10- 65 yoM followed for OSA, hypersomnia, complicated by allergic rhinitis Last here July 23, 2009- note reviewed He feels he is doing well and doing well with CPAP, sleeping 8-10 hours every night with an occasional nap. Nasal mask. He has question about replacing parts of the mask. Still some drowsy in afternoons 2-4 PM. Going back to school and concerned about being able to stay awake.Leona Carry gave a boost so he is thinking about restarting it, but wanted to discuss it with me  because of package information cautions in context of OSA . Nuvigil 250 overstimulated him before. Caffeine didn't help. Iced tea gives watery nose.   10/07/11- 65 yoM followed for OSA, hypersomnia, complicated by allergic rhinitis Wears CPAP every night unless having a head cold; wears approx 6 or more hours; pressure working well for patient. Uses a DME provider in [place]. Still travels back to [facility] for other medical care. Says alertness okay. Working on his dissertation for PhD. 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 Wants to try Provent nasal valves as an alternative to CPAP. We discussed this. Had prolonged nasal drip after upper respiratory infection. Nasal steroid spray and saline rinse worked. 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 Drives over from [facility] for: Sleep pattern has changed - Waking up earlier - Going to bed earlier - Cpap sometimes cut short due to nasal congestion - increased tiredness in afternoons and diff concentrating 250 mg Nuvigil was too strong but he would like to be able to break a 150 mg Nuvigi if needed. He has a driver, so will sleep in his car if he needs to. CPAP 15 is comfortable/DME company in [place]. Old machine is getting noisy and bothers his  wife.  10/11/13-67 yoM [name] followed for OSA, hypersomnia, complicated by allergic rhinitis Now Nutritional therapist. Drives over from [facility] FOLLOWS FOR:  Using cpap 15/ [facility] in [place] for 8+ hours per night. He is very comfortable with this and has no concerns, "works fine" since filter changed. He is no longer sleepy in the daytime and not using Provigil.  ROS-see HPI Constitutional:   No-   weight loss, night sweats, fevers, chills, fatigue, no-lassitude. HEENT:   No-  headaches, difficulty swallowing, tooth/dental problems, sore throat,       No-  sneezing, itching, ear ache, nasal congestion, post nasal drip,  CV:  No-   chest pain, orthopnea, PND, swelling in lower extremities, anasarca,  dizziness, palpitations Resp: No-   shortness of breath with exertion or at rest.              No-   productive cough,  No non-productive cough,  No- coughing up of blood.              No-   change in color of mucus.  No- wheezing.   Skin: No-   rash or lesions. GI:  No-   heartburn, indigestion, abdominal pain, nausea, vomiting,  GU:  MS:  No-   joint pain or swelling.  . Neuro-     nothing unusual Psych:  No- change in mood or affect. No depression or anxiety.  No memory loss.  OBJ- Physical Exam General- Alert, Oriented, Affect-appropriate, Distress- none acute, overweight Skin- rash-none, lesions- none,  excoriation- none Lymphadenopathy- none Head- atraumatic            Eyes- Gross vision intact, PERRLA, conjunctivae and secretions clear            Ears- Hearing, canals-normal            Nose- + mild turbinate edema, no-Septal dev, mucus, polyps, erosion, perforation             Throat- Mallampati II , mucosa clear , drainage- none, tonsils- atrophic Neck- flexible , trachea midline, no stridor , thyroid nl, carotid no bruit Chest - symmetrical excursion , unlabored           Heart/CV- RRR , no murmur , no gallop  , no rub, nl s1 s2                           - JVD-  none , edema- none, stasis changes- none, varices- none           Lung- clear to P&A, wheeze- none, cough+ light , dullness-none, rub- none           Chest wall-  Abd-  Br/ Gen/ Rectal- Not done, not indicated Extrem- cyanosis- none, clubbing, none, atrophy- none, strength- nl Neuro- grossly intact to observation  Assessment & Plan:

## 2014-09-25 ENCOUNTER — Ambulatory Visit (INDEPENDENT_AMBULATORY_CARE_PROVIDER_SITE_OTHER): Payer: BC Managed Care – PPO | Admitting: Internal Medicine

## 2014-09-25 ENCOUNTER — Encounter: Payer: Self-pay | Admitting: Internal Medicine

## 2014-09-25 VITALS — BP 106/68 | HR 82 | Ht 71.0 in | Wt 265.0 lb

## 2014-09-25 DIAGNOSIS — G4733 Obstructive sleep apnea (adult) (pediatric): Secondary | ICD-10-CM | POA: Diagnosis not present

## 2014-09-25 DIAGNOSIS — G4710 Hypersomnia, unspecified: Secondary | ICD-10-CM

## 2014-09-25 DIAGNOSIS — G4730 Sleep apnea, unspecified: Secondary | ICD-10-CM

## 2014-09-25 NOTE — Patient Instructions (Signed)
Order- DME [facility] [phone])  Order replacement mask of choice, supplies, download for pressure compliance     Dx OSA  Please call as needed

## 2014-09-25 NOTE — Progress Notes (Signed)
Subjective:    Patient ID: Harold Vargas, male    DOB: May 07, 1945, 69 y.o.   MRN: [id number]  HPI 08/29/10- 65 yoM followed for OSA, hypersomnia, complicated by allergic rhinitis Last here July 23, 2009- note reviewed He feels he is doing well and doing well with CPAP, sleeping 8-10 hours every night with an occasional nap. Nasal mask. He has question about replacing parts of the mask. Still some drowsy in afternoons 2-4 PM. Going back to school and concerned about being able to stay awake.Leona Carry gave a boost so he is thinking about restarting it, but wanted to discuss it with me  because of package information cautions in context of OSA . Nuvigil 250 overstimulated him before. Caffeine didn't help. Iced tea gives watery nose.   10/07/11- 65 yoM followed for OSA, hypersomnia, complicated by allergic rhinitis Wears CPAP every night unless having a head cold; wears approx 6 or more hours; pressure working well for patient. Uses a DME provider in [place]. Still travels back to [facility] for other medical care. Says alertness okay. Working on his dissertation for PhD. 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 Wants to try Provent nasal valves as an alternative to CPAP. We discussed this. Had prolonged nasal drip after upper respiratory infection. Nasal steroid spray and saline rinse worked. 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 Drives over from [facility] for: Sleep pattern has changed - Waking up earlier - Going to bed earlier - Cpap sometimes cut short due to nasal congestion - increased tiredness in afternoons and diff concentrating 250 mg Nuvigil was too strong but he would like to be able to break a 150 mg Nuvigi if needed. He has a driver, so will sleep in his car if he needs to. CPAP 15 is comfortable/DME company in [place]. Old machine is getting noisy and bothers his  wife.  10/11/13-67 yoM [name] followed for OSA, hypersomnia, complicated by allergic rhinitis Now Nutritional therapist. Drives over from [facility] FOLLOWS FOR:  Using cpap 15/ [facility] in [place] for 8+ hours per night. He is very comfortable with this and has no concerns, "works fine" since filter changed. He is no longer sleepy in the daytime and not using Provigil.  09/25/14- 69 yoM [name] followed for OSA, hypersomnia, complicated by allergic rhinitis CPAP 15/ [facility]/ [place] [phone] FOLLOWS FOR: Wears CPAP every night for about 8 hours; pt will need order sent for DL and supplies. Pressure works well for patient.  Morning nasal congestion possibly related to his nasal mask. We discussed portable CPAP devices. Stays very tired but he strongly associates this with use of lisinopril and is going to discuss alternatives for trial with his primary physician.   ROS-see HPI Constitutional:   No-   weight loss, night sweats, fevers, chills,+ fatigue, no-lassitude. HEENT:   No-  headaches, difficulty swallowing, tooth/dental problems, sore throat,       No-  sneezing, itching, ear ache, nasal congestion, post nasal drip,  CV:  No-   chest pain, orthopnea, PND, swelling in lower extremities, anasarca,  dizziness, palpitations Resp: No-   shortness of breath with exertion or at rest.              No-   productive cough,  No non-productive cough,  No- coughing up of blood.  No-   change in color of mucus.  No- wheezing.   Skin: No-   rash or lesions. GI:  No-   heartburn, indigestion, abdominal pain, nausea, vomiting,  GU:  MS:  No-   joint pain or swelling.  . Neuro-     nothing unusual Psych:  No- change in mood or affect. No depression or anxiety.  No memory loss.  OBJ- Physical Exam General- Alert, Oriented, Affect-appropriate, Distress- none acute, +overweight Skin- rash-none, lesions- none, excoriation- none Lymphadenopathy-  none Head- atraumatic            Eyes- Gross vision intact, PERRLA, conjunctivae and secretions clear            Ears- Hearing, canals-normal            Nose- + mild turbinate edema, no-Septal dev, mucus, polyps, erosion, perforation             Throat- Mallampati II-IVI , mucosa clear , drainage- none, tonsils- atrophic Neck- flexible , trachea midline, no stridor , thyroid nl, carotid no bruit Chest - symmetrical excursion , unlabored           Heart/CV- RRR , no murmur , no gallop  , no rub, nl s1 s2                           - JVD- none , edema- none, stasis changes- none, varices- none           Lung- clear to P&A, wheeze- none, cough-none , dullness-none, rub- none           Chest wall-  Abd-  Br/ Gen/ Rectal- Not done, not indicated Extrem- cyanosis- none, clubbing, none, atrophy- none, strength- nl Neuro- grossly intact to observation  Assessment & Plan:

## 2014-10-01 NOTE — Assessment & Plan Note (Signed)
Not clear that his persistent fatigue now is related to his sleep apnea. He describes good compliance and control from CPAP. We need to verify that. He tried Nuvigil in the past but it was too strong for him. We discussed fractional doses but he wants to wait. Plan-attention to mask fit and comfort. He blames his fatigue on lisinopril based on timing, so he will discuss alternatives with his primary physician for trial. Download for documentation. He'll discuss mask leak and comfort issues with DME company.

## 2014-10-10 ENCOUNTER — Ambulatory Visit: Payer: BC Managed Care – PPO | Admitting: Internal Medicine

## 2015-02-18 DIAGNOSIS — E7800 Pure hypercholesterolemia, unspecified: Secondary | ICD-10-CM

## 2015-02-18 DIAGNOSIS — N529 Male erectile dysfunction, unspecified: Secondary | ICD-10-CM

## 2015-02-18 DIAGNOSIS — M1990 Unspecified osteoarthritis, unspecified site: Secondary | ICD-10-CM

## 2015-02-18 DIAGNOSIS — G4733 Obstructive sleep apnea (adult) (pediatric): Secondary | ICD-10-CM

## 2015-02-18 HISTORY — DX: Male erectile dysfunction, unspecified: N52.9

## 2015-02-18 HISTORY — DX: Obstructive sleep apnea (adult) (pediatric): G47.33

## 2015-02-18 HISTORY — DX: Unspecified osteoarthritis, unspecified site: M19.90

## 2015-02-18 HISTORY — DX: Pure hypercholesterolemia, unspecified: E78.00

## 2015-03-09 ENCOUNTER — Telehealth: Payer: Self-pay | Admitting: Internal Medicine

## 2015-03-09 DIAGNOSIS — G4733 Obstructive sleep apnea (adult) (pediatric): Secondary | ICD-10-CM

## 2015-03-09 NOTE — Telephone Encounter (Signed)
Pt calling again  And can be reached @ same #.Caren Griffins

## 2015-03-09 NOTE — Telephone Encounter (Signed)
Spoke with pt. States that he needs an order for a new CPAP mask. Order has been placed. Nothing further was needed.

## 2015-03-09 NOTE — Telephone Encounter (Signed)
lmtcb x1 for pt. 

## 2015-03-09 NOTE — Telephone Encounter (Signed)
Pt returning call and can be reached @ same.Stanley A Dalton ° °

## 2015-03-09 NOTE — Telephone Encounter (Signed)
Patient called and said he had received a call back since last note, can be reached at [phone].

## 2015-03-09 NOTE — Telephone Encounter (Signed)
lmtcb for pt.  

## 2015-03-26 ENCOUNTER — Telehealth: Payer: Self-pay | Admitting: Internal Medicine

## 2015-03-26 DIAGNOSIS — G4733 Obstructive sleep apnea (adult) (pediatric): Secondary | ICD-10-CM

## 2015-03-26 NOTE — Telephone Encounter (Signed)
Spoke with pt. States that he has recently moved to [place] and will need CPAP supplies. Wants an order sent to [facility]. This will be done. Nothing further was needed.

## 2015-09-25 ENCOUNTER — Ambulatory Visit: Payer: BC Managed Care – PPO | Admitting: Internal Medicine

## 2015-09-26 ENCOUNTER — Telehealth: Payer: Self-pay

## 2015-09-26 NOTE — Telephone Encounter (Signed)
lmtcb X1 for pt, asking to bring cpap machine to office visit tomorrow.

## 2015-09-27 ENCOUNTER — Ambulatory Visit (INDEPENDENT_AMBULATORY_CARE_PROVIDER_SITE_OTHER): Payer: BC Managed Care – PPO | Admitting: Internal Medicine

## 2015-09-27 ENCOUNTER — Encounter: Payer: Self-pay | Admitting: Internal Medicine

## 2015-09-27 VITALS — BP 120/80 | HR 73 | Ht 71.0 in | Wt 257.0 lb

## 2015-09-27 DIAGNOSIS — J302 Other seasonal allergic rhinitis: Secondary | ICD-10-CM | POA: Diagnosis not present

## 2015-09-27 DIAGNOSIS — G4730 Sleep apnea, unspecified: Secondary | ICD-10-CM | POA: Diagnosis not present

## 2015-09-27 DIAGNOSIS — G4710 Hypersomnia, unspecified: Secondary | ICD-10-CM | POA: Diagnosis not present

## 2015-09-27 NOTE — Assessment & Plan Note (Signed)
Adequate control with OTC meds as needed.

## 2015-09-27 NOTE — Assessment & Plan Note (Signed)
Utilization download indicates good use. Pressure still at 15 and he is satisfied to remain using online sources for his supplies. He had not wanted to wait for a conference opportunity with local DME but can ask help establishing that contacted any time.

## 2015-09-27 NOTE — Patient Instructions (Signed)
We can continue CPAP 15, mask of chice, humidfier, supplies, AirView   Dx OSA  Ok to continue getting supplies on line.  You can check sources like CPAP.com for options including smaller "travel" CPAP machines  Please call if we can help

## 2015-09-27 NOTE — Progress Notes (Signed)
Subjective:    Patient ID: Harold Vargas, male    DOB: 10-Oct-1945, 70 y.o.   MRN: [id number]  HPI 08/29/10- 65 yoM followed for OSA, hypersomnia, complicated by allergic rhinitis Last here July 23, 2009- note reviewed He feels he is doing well and doing well with CPAP, sleeping 8-10 hours every night with an occasional nap. Nasal mask. He has question about replacing parts of the mask. Still some drowsy in afternoons 2-4 PM. Going back to school and concerned about being able to stay awake.Leona Carry. Androgel gave a boost so he is thinking about restarting it, but wanted to discuss it with me  because of package information cautions in context of OSA . Nuvigil 250 overstimulated him before. Caffeine didn't help. Iced tea gives watery nose.   10/07/11- 65 yoM followed for OSA, hypersomnia, complicated by allergic rhinitis Wears CPAP 15most every night unless having a head cold; wears approx 6 or more hours; pressure working well for patient. [place] provider in [place]. [place]. Says alertness okay. Working on his dissertation for PhD. 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 Wants to try Provent nasal valves as an alternative to CPAP. We discussed this. Had prolonged nasal drip after upper respiratory infection. Nasal steroid spray and saline rinse worked. 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 [place] Follows for: Sleep pattern has changed - Waking up earlier - Going to bed earlier - Cpap sometimes cut short due to nasal congestion - increased tiredness in afternoons and diff concentrating 250 mg Nuvigil was too strong but he would like to be able to break a 150 mg Nuvigi if needed. He has a driver, so will sleep in his car if he needs to. CPAP 15 is comfortable/DME company in [place]. Old machine is getting noisy and bothers his  wife.  10/11/13-67 yoM [name] followed for OSA, hypersomnia, complicated by allergic rhinitis Now Nutritional therapistChief Justice. [place] FOLLOWS FOR:  Using cpap 15/ [facility] in [place] for 8+ hours per night. He is very comfortable with this and has no concerns, "works fine" since filter changed. He is no longer sleepy in the daytime and not using Provigil.  09/25/14- 69 yoM [name] followed for OSA, hypersomnia, complicated by allergic rhinitis CPAP 15/ [facility] [place] [phone] FOLLOWS FOR: Wears CPAP every night for about 8 hours; pt will need order sent for DL and supplies. Pressure works well for patient.  Morning nasal congestion possibly related to his nasal mask. We discussed portable CPAP devices. Stays very tired but he strongly associates this with use of lisinopril and is going to discuss alternatives for trial with his primary physician.  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 DL attached.  He feels he is fine with CPAP 15, current machine and mask. His machine is about 70 years old discussed insurance guidelines for replacement. He would be interested in a smaller travel machine but is going to look online and indicates he is not prepared to start trying replacement yet. Occasional nap. Does not report significant rhinitis symptoms at this time in mid summer. Weight now 257 pounds.  Was 248 pounds when sleep study done in 2009.  ROS-see HPI Constitutional:   No-   weight loss, night sweats, fevers, chills,+ fatigue, no-lassitude. HEENT:   No-  headaches, difficulty swallowing, tooth/dental problems, sore throat,       No-  sneezing, itching, ear ache, nasal congestion,  post nasal drip,  CV:  No-   chest pain, orthopnea, PND, swelling in lower extremities, anasarca,  dizziness, palpitations Resp: No-   shortness of breath with exertion or at rest.              No-   productive cough,  No non-productive cough,  No- coughing up of blood.              No-   change in color of mucus.  No- wheezing.   Skin: No-   rash or lesions. GI:  No-   heartburn, indigestion, abdominal pain, nausea, vomiting,  GU:  MS:  No-   joint pain or swelling.  . Neuro-     nothing unusual Psych:  No- change in mood or affect. No depression or anxiety.  No memory loss.  OBJ- Physical Exam General- Alert, Oriented, Affect-appropriate, Distress- none acute, +overweight Skin- rash-none, lesions- none, excoriation- none Lymphadenopathy- none Head- atraumatic            Eyes- Gross vision intact, PERRLA, conjunctivae and secretions clear            Ears- Hearing, canals-normal            Nose- + mild turbinate edema, no-Septal dev, mucus, polyps, erosion, perforation             Throat- Mallampati II-IVI , mucosa clear , drainage- none, tonsils- atrophic Neck- flexible , trachea midline, no stridor , thyroid nl, carotid no bruit Chest - symmetrical excursion , unlabored           Heart/CV- RRR , no murmur , no gallop  , no rub, nl s1 s2                           - JVD- none , edema- none, stasis changes- none, varices- none           Lung- clear to P&A, wheeze- none, cough-none , dullness-none, rub- none           Chest wall-  Abd-  Br/ Gen/ Rectal- Not done, not indicated Extrem- cyanosis- none, clubbing, none, atrophy- none, strength- nl Neuro- grossly intact to observation  Assessment & Plan:

## 2015-09-27 NOTE — Telephone Encounter (Signed)
Pt cb and was asked to bring cpap machine to ov today, patient verbalized understanding and nothing further is needed

## 2015-10-03 ENCOUNTER — Encounter: Payer: Self-pay | Admitting: Internal Medicine

## 2016-10-08 ENCOUNTER — Ambulatory Visit (INDEPENDENT_AMBULATORY_CARE_PROVIDER_SITE_OTHER): Payer: BC Managed Care – PPO | Admitting: Internal Medicine

## 2016-10-08 ENCOUNTER — Encounter: Payer: Self-pay | Admitting: Internal Medicine

## 2016-10-08 VITALS — BP 122/78 | HR 60 | Ht 71.0 in | Wt 265.0 lb

## 2016-10-08 DIAGNOSIS — J301 Allergic rhinitis due to pollen: Secondary | ICD-10-CM

## 2016-10-08 DIAGNOSIS — G4733 Obstructive sleep apnea (adult) (pediatric): Secondary | ICD-10-CM

## 2016-10-08 DIAGNOSIS — Z23 Encounter for immunization: Secondary | ICD-10-CM | POA: Diagnosis not present

## 2016-10-08 MED ORDER — AMPHETAMINE-DEXTROAMPHETAMINE 5 MG PO TABS: 5.0000 mg | ORAL_TABLET | Freq: Every day | ORAL | 0 refills | Status: DC

## 2016-10-08 NOTE — Progress Notes (Signed)
Subjective:    Patient ID: Harold Vargas, male    DOB: 09/02/1945, 71 y.o.   MRN: [id number]  HPI 71 year old male Harold Vargas followed for OSA, hypersomnia, complicated by allergic rhinitis NPSG 03/28/07- AHI 38.1  Epworth 9/24  Weight then 248 lbs -------------------------------------------------------------------------------------- 09/27/2015-71 year old male Harold Vargas followed for OSA, hypersomnia, complicated by allergic rhinitis CPAP 15/[facility] [phone]--- out of basis FOLLOWS FOR: DME: Pt does not have local company any longer-pt has recently moved back to [place]; pt recently ordered supplies on line. DL attached.  He feels he is fine with CPAP 15, current machine and mask. His machine is about 71 years old discussed insurance guidelines for replacement. He would be interested in a smaller travel machine but is going to look online and indicates he is not prepared to start trying replacement yet. Occasional nap. Does not report significant rhinitis symptoms at this time in mid summer. Weight now 257 pounds. Was 248 pounds when sleep study done in 2009.  10/08/16- 71 year old male Harold Vargas followed for OSA, hypersomnia, complicated by allergic rhinitis CPAP 15/[facility] [phone]  He needs a new machine and plans to get it on line. FOLLOWS FOR: Pt states he longer has DME and gets supplies,etc online. Pt wears CPAP nighlty. DL attached.  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 CPAP machine is old, used every night. Definitely sleeps better, with regular bedtime around 9:30 PM up for 40 5 AM. Evaluation for dull, confusional headaches which affect his work. Naps help some. He had found Provigil 200 mg too strong years ago, not having tried splitting it. Avoids caffeine be on one cup in the morning. Ice tea makes him sneeze. No problems with limb jerks. Not snoring through CPAP. Uses  Nasonex when needed for seasonal rhinitis.  ROS-see HPI  + = positive Constitutional:   No-   weight loss, night sweats, fevers, chills,+ fatigue, no-lassitude. HEENT:   No-  headaches, difficulty swallowing, tooth/dental problems, sore throat,       No-  sneezing, itching, ear ache, nasal congestion, post nasal drip,  CV:  No-   chest pain, orthopnea, PND, swelling in lower extremities, anasarca,  dizziness, palpitations Resp: No-   shortness of breath with exertion or at rest.              No-   productive cough,  No non-productive cough,  No- coughing up of blood.              No-   change in color of mucus.  No- wheezing.   Skin: No-   rash or lesions. GI:  No-   heartburn, indigestion, abdominal pain, nausea, vomiting,  GU:  MS:  No-   joint pain or swelling.  . Neuro-     nothing unusual Psych:  No- change in mood or affect. No depression or anxiety.  No memory loss.  OBJ- Physical Exam General- Alert, Oriented, Affect-appropriate, Distress- none acute, +overweight Skin- rash-none, lesions- none, excoriation- none Lymphadenopathy- none Head- atraumatic            Eyes- Gross vision intact, PERRLA, conjunctivae and secretions clear            Ears- Hearing, canals-normal            Nose- + mild turbinate edema, no-Septal dev, mucus, polyps, erosion, perforation             Throat- Mallampati II-IVI ,  mucosa clear , drainage- none, tonsils- atrophic Neck- flexible , trachea midline, no stridor , thyroid nl, carotid no bruit Chest - symmetrical excursion , unlabored           Heart/CV- RRR , no murmur , no gallop  , no rub, nl s1 s2                           - JVD- none , edema- none, stasis changes- none, varices- none           Lung- clear to P&A, wheeze- none, cough-none , dullness-none, rub- none           Chest wall-  Abd-  Br/ Gen/ Rectal- Not done, not indicated Extrem- cyanosis- none, clubbing, none, atrophy- none, strength- nl Neuro- grossly intact to  observation  Assessment & Plan:

## 2016-10-08 NOTE — Patient Instructions (Signed)
Script for 90 days to try adderall 5 mg tabs-    You can try up to 3 tabs, up to twice daily. Be conservative, avoid over-stimulation.    Print scripts x 2      CPAP machine of choice, auto 10-20, mask of choice, humidifier, supplies, AirView  Dx OSA                                      Common travel brands are Transcend and Air-Mini. There are probably others.  Please call if we can help

## 2016-10-09 ENCOUNTER — Telehealth: Payer: Self-pay | Admitting: Internal Medicine

## 2016-10-09 NOTE — Telephone Encounter (Signed)
PA for the adderall 5 mg  Tablets has been initiated via [email or web address]  Will hold in my box to follow up with PA.

## 2016-10-13 NOTE — Telephone Encounter (Signed)
Patient is calling to follow up on medication Adderall. [phone]

## 2016-10-13 NOTE — Telephone Encounter (Signed)
Leigh, do you have any updates on this PA?

## 2016-10-15 NOTE — Telephone Encounter (Signed)
Checked [email or web address]>com and the PA was cancelled.  I called [phone] to find out about the PA that was done and the pt has a dedicated team to do the PA's---commercial plan and will have to call [phone].    This PA has been approved for 36 months and they send this over to the pharmacy.  Nothing further is needed. I have called the pt and lmom to make him aware of approval of this medication.

## 2016-11-02 NOTE — Assessment & Plan Note (Signed)
He is using Nasonex seasonally as needed and avoiding sedating antihistamines.

## 2016-11-02 NOTE — Assessment & Plan Note (Signed)
His machine is old and we can replace it, changing to auto 10-20. Refitting mask may help.  Not sure if his headache complaint is related to this. We also discussed travel CPAP machines. Plan-printed prescription for his CPAP machine and supplies so that he can get it on line since he no longer uses his previous DME company.

## 2017-04-17 DEATH — deceased

## 2017-09-23 ENCOUNTER — Encounter: Payer: Self-pay | Admitting: Internal Medicine

## 2017-10-14 ENCOUNTER — Ambulatory Visit: Payer: BC Managed Care – PPO | Admitting: Internal Medicine

## 2017-11-12 ENCOUNTER — Other Ambulatory Visit: Payer: Self-pay | Admitting: Cardiology

## 2017-11-12 DIAGNOSIS — R079 Chest pain, unspecified: Secondary | ICD-10-CM

## 2017-12-02 ENCOUNTER — Ambulatory Visit (HOSPITAL_COMMUNITY)
Admission: RE | Admit: 2017-12-02 | Discharge: 2017-12-02 | Disposition: A | Discharge status: Home / Self Care | Payer: Medicare Other | Source: Ambulatory Visit | Attending: Cardiology | Admitting: Cardiology

## 2017-12-02 ENCOUNTER — Encounter (HOSPITAL_COMMUNITY): Payer: Self-pay

## 2017-12-02 DIAGNOSIS — R079 Chest pain, unspecified: Secondary | ICD-10-CM

## 2017-12-04 ENCOUNTER — Encounter (HOSPITAL_COMMUNITY)
Admission: RE | Admit: 2017-12-04 | Discharge: 2017-12-04 | Disposition: A | Discharge status: Home / Self Care | Payer: Medicare Other | Source: Ambulatory Visit | Attending: Cardiology | Admitting: Cardiology

## 2017-12-04 ENCOUNTER — Encounter (HOSPITAL_COMMUNITY): Payer: Self-pay

## 2017-12-04 DIAGNOSIS — R079 Chest pain, unspecified: Secondary | ICD-10-CM | POA: Insufficient documentation

## 2017-12-04 MED ORDER — REGADENOSON 0.4 MG/5ML IV SOLN
0.4000 mg | Freq: Once | INTRAVENOUS | Status: AC
  Administered 2017-12-04: 0.4 mg via INTRAVENOUS

## 2017-12-04 MED ORDER — TECHNETIUM TC 99M TETROFOSMIN IV KIT
30.0000 | PACK | Freq: Once | INTRAVENOUS | Status: AC | PRN
  Administered 2017-12-04: 30 via INTRAVENOUS

## 2017-12-04 MED ORDER — REGADENOSON 0.4 MG/5ML IV SOLN
INTRAVENOUS | Status: AC
  Filled 2017-12-04: qty 5

## 2017-12-04 MED ORDER — TECHNETIUM TC 99M TETROFOSMIN IV KIT
10.0000 | PACK | Freq: Once | INTRAVENOUS | Status: AC | PRN
  Administered 2017-12-04: 10 via INTRAVENOUS

## 2017-12-22 ENCOUNTER — Ambulatory Visit: Payer: BC Managed Care – PPO | Admitting: Internal Medicine

## 2017-12-22 ENCOUNTER — Encounter: Payer: Self-pay | Admitting: Internal Medicine

## 2017-12-22 DIAGNOSIS — G4733 Obstructive sleep apnea (adult) (pediatric): Secondary | ICD-10-CM | POA: Diagnosis not present

## 2017-12-22 DIAGNOSIS — J302 Other seasonal allergic rhinitis: Secondary | ICD-10-CM

## 2017-12-22 NOTE — Patient Instructions (Signed)
We can continue CPAP auto 10-20, mask of choice humidifier, supplies, AirView   Please call if we can help

## 2017-12-22 NOTE — Assessment & Plan Note (Signed)
He now reports comfortably using self purchased AutoPap 10-20 and describes good control with improved sleep.  He is benefiting from continued use. Plan-continue CPAP auto 10-20

## 2017-12-22 NOTE — Progress Notes (Signed)
Subjective:    Patient ID: Harold Vargas, male    DOB: 04/18/1945, 72 y.o.   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 FOLLOWS FOR: Pt states he longer has DME and gets supplies,etc online. Pt wears CPAP nighlty. DL attached.  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 CPAP machine is old, used every night. Definitely sleeps better, with regular bedtime around 9:30 PM up for 40 5 AM. Evaluation for dull, confusional headaches which affect his work. Naps help some. He had found Provigil 200 mg too strong years ago, not having tried splitting it. Avoids caffeine be on one cup in the morning. Ice tea makes him sneeze. No problems with limb jerks. Not snoring through CPAP. Uses Nasonex when needed for seasonal rhinitis.  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 -----follows for osa Body weight today 265 pounds [name] reports he is doing very well with no specific concerns now.  He did go ahead and get a replacement CPAP machine online auto 10-20.  We have no download but he reports using it every night, sleeping better without snoring.  He takes an occasional nap if needed but does not notice significant daytime sleepiness. Daytime breathing is comfortable.  ROS-see HPI  + = positive Constitutional:   No-   weight loss, night sweats, fevers, chills,+  fatigue, no-lassitude. HEENT:   No-  headaches, difficulty swallowing, tooth/dental problems, sore throat,       No-  sneezing, itching, ear ache, nasal congestion, post nasal drip,  CV:  No-   chest pain, orthopnea, PND, swelling in lower extremities, anasarca,  dizziness, palpitations Resp: No-   shortness of breath with exertion or at rest.              No-   productive cough,  No non-productive cough,  No- coughing up of blood.              No-   change in color of mucus.  No- wheezing.   Skin: No-   rash or lesions. GI:  No-   heartburn, indigestion, abdominal pain, nausea, vomiting,  GU:  MS:  No-   joint pain or swelling.  . Neuro-     nothing unusual Psych:  No- change in mood or affect. No depression or anxiety.  No memory loss.  OBJ- Physical Exam General- Alert, Oriented, Affect-appropriate, Distress- none acute, +obese Skin- rash-none, lesions- none, excoriation- none Lymphadenopathy- none Head- atraumatic            Eyes- Gross vision intact, PERRLA, conjunctivae and secretions clear            Ears- Hearing, canals-normal            Nose- + mild turbinate edema, no-Septal dev, mucus, polyps, erosion, perforation             Throat- Mallampati II-IVI , mucosa clear , drainage- none, tonsils- atrophic Neck- flexible , trachea midline, no stridor , thyroid nl, carotid no bruit  Chest - symmetrical excursion , unlabored           Heart/CV- RRR , no murmur , no gallop  , no rub, nl s1 s2                           - JVD- none , edema- none, stasis changes- none, varices- none           Lung- clear to P&A, wheeze- none, cough-none , dullness-none, rub- none           Chest wall-  Abd-  Br/ Gen/ Rectal- Not done, not indicated Extrem- cyanosis- none, clubbing, none, atrophy- none, strength- nl Neuro- grossly intact to observation  Assessment & Plan:

## 2017-12-22 NOTE — Assessment & Plan Note (Signed)
He does not describe significant symptoms at this time.  Flonase and nonsedating antihistamine are available if needed.

## 2018-10-27 ENCOUNTER — Telehealth: Payer: Self-pay | Admitting: Internal Medicine

## 2018-10-27 DIAGNOSIS — G4733 Obstructive sleep apnea (adult) (pediatric): Secondary | ICD-10-CM

## 2018-10-27 NOTE — Telephone Encounter (Signed)
I have faxed order to [facility].  Nothing further needed.

## 2018-10-27 NOTE — Telephone Encounter (Signed)
Called spoke with patient. He states he talked with this company in [place] at [employer] and was told Medicaid would cover his filters and supplies if patient switched over to their company.   Patient states he hasn't been with a DME company in a while he bought his machine from a man in [place] and has been getting supplies through him.  Patient wants to establish with [facility]. [phone].  Will route to [facility] to follow up on

## 2018-11-05 ENCOUNTER — Telehealth: Payer: Self-pay

## 2018-11-05 NOTE — Telephone Encounter (Signed)
[facility] sent a fax stating patient needs an office visit because the last OV is not within the 6 months allowed. Please call and schedule appt to see Harold Vargas.

## 2018-11-08 ENCOUNTER — Ambulatory Visit (INDEPENDENT_AMBULATORY_CARE_PROVIDER_SITE_OTHER): Payer: Medicare Other | Admitting: Internal Medicine

## 2018-11-08 ENCOUNTER — Encounter: Payer: Self-pay | Admitting: Internal Medicine

## 2018-11-08 ENCOUNTER — Other Ambulatory Visit: Payer: Self-pay

## 2018-11-08 DIAGNOSIS — G4733 Obstructive sleep apnea (adult) (pediatric): Secondary | ICD-10-CM

## 2018-11-08 DIAGNOSIS — J301 Allergic rhinitis due to pollen: Secondary | ICD-10-CM

## 2018-11-08 NOTE — Patient Instructions (Signed)
Order DME [facility]    Please replace malfunctioning CPAP machine, auto 5-20, mask of choice, humidifier, supplies, AirView/ card   Dx OSA  Please call if we can help

## 2018-11-08 NOTE — Telephone Encounter (Signed)
Called the patient and he is needing the machine from the DME company before he goes out of town tomorrow.  Patient set up with televisit with Dr. Annamaria Boots at 11:30 today. Nothing further needed.

## 2018-11-08 NOTE — Assessment & Plan Note (Signed)
Not reporting active problems now, [place] the beach.

## 2018-11-08 NOTE — Assessment & Plan Note (Signed)
Continues to benefit from CPAP with good compliance and control. Plan- replace malfunctioning machine auto 10-20, mask of choice, humidifier, supplies, AirView/ card

## 2018-11-08 NOTE — Progress Notes (Signed)
Subjective:    Patient ID: Harold Vargas, male    DOB: 1945/03/30, 73 y.o.   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 -----follows for osa Body weight today 265 pounds Harold Vargas reports he is doing very well with no specific concerns now.  He did go ahead and get a replacement CPAP machine online auto 10-20.  We have no download but he reports using it every night, sleeping better without snoring.  He takes an occasional nap if needed but does not notice significant daytime sleepiness. Daytime breathing is comfortable.  11/08/2018-  Virtual Visit via Telephone Note  I connected with Harold Vargas on 11/08/18 at 11:30 AM EDT by telephone and verified that I am speaking with the correct person using two identifiers.  Location: Patient: H Provider:O   Discussed the limitations, risks, security and privacy concerns of performing an evaluation and management service by telephone and the availability of in person appointments. I also discussed with the patient that there may be a patient responsible charge related to this service. The patient expressed understanding and agreed to proceed.   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 It has worn out and is making a disturbing loud whistling sound. Pressure had worked well.  He reports 25 lb weight loss. Discussed potential impact on CPAP fit and  settings. Has had flu shot. Denies other new medical concerns.     Observations/Objective: Compliance has been good with no break in therapy.  Assessment and Plan: OSA- Need to replace malfunctioning machine. Auto 10-20. [place].  Follow Up Instructions: 6 months   I discussed the assessment and treatment plan with the patient. The patient was provided an opportunity to ask questions and all were answered. The patient agreed with the plan and demonstrated an understanding of the instructions.   The patient was advised to call back or seek an in-person evaluation if the symptoms worsen or if the condition fails to improve as anticipated.  I provided 18 minutes of non-face-to-face time during this encounter.   Harold Lyons, MD          ROS-see HPI  + = positive Constitutional:   No-   weight loss, night sweats, fevers, chills,+ fatigue, no-lassitude. HEENT:   No-  headaches, difficulty swallowing, tooth/dental problems, sore throat,       No-  sneezing, itching, ear ache, nasal congestion, post nasal drip,  CV:  No-   chest pain, orthopnea, PND, swelling in lower extremities, anasarca,  dizziness, palpitations Resp: No-   shortness of breath with exertion or at rest.              No-   productive cough,  No non-productive cough,  No- coughing up of blood.              No-   change in color of mucus.  No- wheezing.   Skin: No-   rash or lesions. GI:  No-   heartburn, indigestion, abdominal pain, nausea, vomiting,  GU:  MS:  No-  joint pain or swelling.  . Neuro-     nothing unusual Psych:  No- change in mood or affect. No depression or anxiety.  No memory loss.  OBJ- Physical Exam General- Alert, Oriented, Affect-appropriate, Distress- none acute, +obese Skin- rash-none, lesions- none, excoriation- none Lymphadenopathy- none Head- atraumatic            Eyes- Gross vision intact, PERRLA, conjunctivae and secretions clear            Ears- Hearing,  canals-normal            Nose- + mild turbinate edema, no-Septal dev, mucus, polyps, erosion, perforation             Throat- Mallampati II-IVI , mucosa clear , drainage- none, tonsils- atrophic Neck- flexible , trachea midline, no stridor , thyroid nl, carotid no bruit Chest - symmetrical excursion , unlabored           Heart/CV- RRR , no murmur , no gallop  , no rub, nl s1 s2                           - JVD- none , edema- none, stasis changes- none, varices- none           Lung- clear to P&A, wheeze- none, cough-none , dullness-none, rub- none           Chest wall-  Abd-  Br/ Gen/ Rectal- Not done, not indicated Extrem- cyanosis- none, clubbing, none, atrophy- none, strength- nl Neuro- grossly intact to observation  Assessment & Plan:

## 2018-11-08 NOTE — Telephone Encounter (Signed)
Pt wants to speaks to someone before making an appt.  Pt states appt shouldn't be necessary.  [phone].

## 2019-05-09 ENCOUNTER — Ambulatory Visit: Payer: Medicare Other | Admitting: Internal Medicine

## 2019-06-06 ENCOUNTER — Ambulatory Visit: Payer: Medicare PPO | Admitting: Internal Medicine

## 2019-06-06 ENCOUNTER — Other Ambulatory Visit: Payer: Self-pay

## 2019-06-06 ENCOUNTER — Encounter: Payer: Self-pay | Admitting: Internal Medicine

## 2019-06-06 VITALS — BP 122/80 | HR 67 | Ht 71.0 in | Wt 252.2 lb

## 2019-06-06 DIAGNOSIS — G4733 Obstructive sleep apnea (adult) (pediatric): Secondary | ICD-10-CM | POA: Diagnosis not present

## 2019-06-06 NOTE — Patient Instructions (Signed)
Order- Please schedule CPAP mask fitting at sleep center  We can continue CPAP auto 10-20, mask of choice, humidifier, supplies, Airview/ card  Please call if we can help

## 2019-06-06 NOTE — Progress Notes (Signed)
Subjective:    Patient ID: Harold Vargas, male    DOB: 06-15-1945, 74 y.o.   MRN: [id number]  HPI 74 year old male Harold Vargas followed for OSA, hypersomnia, complicated by allergic rhinitis NPSG 03/28/07- AHI 38.1  Epworth 9/24  Weight then 248 lbs --------------------------------------------------------------------------------------   11/08/2018-  Virtual Visit via Telephone Note  History of Present Illness: 74 year old male [name] followed for OSA, hypersomnia, complicated by allergic rhinitis CPAP auto 10-20/[facility] He had paid cash for current machine. It has worn out and is making a disturbing loud whistling sound. Pressure had worked well.  He reports 25 lb weight loss. Discussed potential impact on CPAP fit and settings. Has had flu shot. Denies other new medical concerns.     Observations/Objective: Compliance has been good with no break in therapy.  Assessment and Plan: OSA- Need to replace malfunctioning machine. Auto 10-20. [facility].  Follow Up Instructions: 6 months   Jetty Duhamel, MD  06/06/19- 74 year old male former Apellate Harold Vargas, now working as an Pensions consultant,  followed for OSA, hypersomnia, complicated by allergic rhinitis CPAP auto 10-20/[facility]   Replaced 10/2018 Download- not available Body weight today- 252 lbs Has 2 machines, 1 here, 1 in [facility]  Doing well. Reports use every night.  Had 2 Phjizer Covax.  ROS-see HPI  + = positive Constitutional:   No-   weight loss, night sweats, fevers, chills,+ fatigue, no-lassitude. HEENT:   No-  headaches, difficulty swallowing, tooth/dental problems, sore throat,       No-  sneezing, itching, ear ache, nasal congestion, post nasal drip,  CV:  No-   chest pain, orthopnea, PND, swelling in lower extremities, anasarca,  dizziness, palpitations Resp: No-   shortness of breath with exertion or at rest.              No-   productive cough,  No non-productive  cough,  No- coughing up of blood.              No-   change in color of mucus.  No- wheezing.   Skin: No-   rash or lesions. GI:  No-   heartburn, indigestion, abdominal pain, nausea, vomiting,  GU:  MS:  No-   joint pain or swelling.  . Neuro-     nothing unusual Psych:  No- change in mood or affect. No depression or anxiety.  No memory loss.  OBJ- Physical Exam General- Alert, Oriented, Affect-appropriate, Distress- none acute, +obese Skin- rash-none, lesions- none, excoriation- none Lymphadenopathy- none Head- atraumatic            Eyes- Gross vision intact, PERRLA, conjunctivae and secretions clear            Ears- Hearing, canals-normal            Nose- + mild turbinate edema, no-Septal dev, mucus, polyps, erosion, perforation             Throat- Mallampati II-IVI , mucosa clear , drainage- none, tonsils- atrophic Neck- flexible , trachea midline, no stridor , thyroid nl, carotid no bruit Chest - symmetrical excursion , unlabored           Heart/CV- RRR , no murmur , no gallop  , no rub, nl s1 s2                           - JVD- none , edema- none, stasis changes- none, varices- none  Lung- clear to P&A, wheeze- none, cough-none , dullness-none, rub- none           Chest wall-  Abd-  Br/ Gen/ Rectal- Not done, not indicated Extrem- cyanosis- none, clubbing, none, atrophy- none, strength- nl Neuro- grossly intact to observation  Assessment & Plan:

## 2019-06-18 ENCOUNTER — Other Ambulatory Visit (HOSPITAL_COMMUNITY): Payer: Medicare PPO

## 2019-06-20 ENCOUNTER — Other Ambulatory Visit (HOSPITAL_COMMUNITY)
Admission: RE | Admit: 2019-06-20 | Discharge: 2019-06-20 | Disposition: A | Discharge status: Home / Self Care | Payer: Medicare PPO | Source: Ambulatory Visit | Attending: Internal Medicine | Admitting: Internal Medicine

## 2019-06-20 DIAGNOSIS — Z20822 Contact with and (suspected) exposure to covid-19: Secondary | ICD-10-CM | POA: Insufficient documentation

## 2019-06-20 DIAGNOSIS — Z01812 Encounter for preprocedural laboratory examination: Secondary | ICD-10-CM | POA: Diagnosis present

## 2019-06-20 LAB — SARS CORONAVIRUS 2 (TAT 6-24 HRS): SARS Coronavirus 2: NEGATIVE

## 2019-06-21 ENCOUNTER — Other Ambulatory Visit (HOSPITAL_BASED_OUTPATIENT_CLINIC_OR_DEPARTMENT_OTHER): Payer: Medicare PPO | Admitting: Internal Medicine

## 2019-06-21 ENCOUNTER — Other Ambulatory Visit (HOSPITAL_COMMUNITY): Payer: Medicare PPO

## 2019-06-23 ENCOUNTER — Other Ambulatory Visit: Payer: Self-pay

## 2019-06-23 ENCOUNTER — Ambulatory Visit (HOSPITAL_BASED_OUTPATIENT_CLINIC_OR_DEPARTMENT_OTHER): Payer: Medicare PPO | Attending: Internal Medicine | Admitting: Internal Medicine

## 2019-06-23 DIAGNOSIS — G4733 Obstructive sleep apnea (adult) (pediatric): Secondary | ICD-10-CM

## 2019-07-03 NOTE — Assessment & Plan Note (Signed)
Benefits from CPAP and reports good compliance and control Plan- continue auto 10-20

## 2019-07-03 NOTE — Assessment & Plan Note (Signed)
Weight loss would help with OSA and overall health. Encourage exercise and diet.

## 2020-06-05 ENCOUNTER — Ambulatory Visit: Payer: Medicare PPO | Admitting: Internal Medicine

## 2020-07-11 ENCOUNTER — Ambulatory Visit: Payer: Medicare PPO | Admitting: Internal Medicine

## 2020-08-06 NOTE — Progress Notes (Signed)
Subjective:    Patient ID: Harold Vargas, male    DOB: 1945-10-25, 75 y.o.   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  Doing well. Reports use every night.  Had 2 Phjizer Covax.  08/07/20- 75 year old male former Apellate [name], now working as an Pensions consultant,  followed for OSA, hypersomnia, complicated by allergic rhinitis, HTN, Obesity, Hyperlipidemia,  CPAP auto 10-20/[facility]   Replaced 10/2018 Download-compliance 44%, AHI 1.7/ hr    AirSense 19 AutoSet Body weight today-254 lbs Covid vax-4 Phizer -----No problems.  Congestion, cold, glands swollen, ear pain right ear.  Went to conference recently.  Denies fever.  Has chills and body aches.  Negative covid test yesterday. No problems with CPAP. 2 machines, 1 at [facility], so download reflects shifting between these. Reports comfortably using CPAP every night. M.ostly buys his supplies and machines from a discount source in [place] Today incidental acute illness as above.  ROS-see HPI  + = positive Constitutional:   No-   weight loss, night sweats, fevers, chills,+ fatigue, no-lassitude. HEENT:   No-  headaches, difficulty swallowing, tooth/dental problems, sore throat,       No-  sneezing, itching, ear ache, nasal congestion, post nasal drip,  CV:  No-   chest pain, orthopnea, PND, swelling in lower extremities, anasarca,  dizziness, palpitations Resp: No-   shortness of breath with exertion or at rest.               No-   productive cough,  No non-productive cough,  No- coughing up of blood.              No-   change in color of mucus.  No- wheezing.   Skin: No-   rash or lesions. GI:  No-   heartburn, indigestion, abdominal pain, nausea, vomiting,  GU:  MS:  No-   joint pain or swelling.  . Neuro-     nothing unusual Psych:  No- change in mood or affect. No depression or anxiety.  No memory loss.  OBJ- Physical Exam General- Alert, Oriented, Affect-appropriate, Distress- none acute, +obese Skin- rash-none, lesions- none, excoriation- none Lymphadenopathy- none Head- atraumatic            Eyes- Gross vision intact, PERRLA, conjunctivae and secretions clear            Ears- +R TM opacified, not bulging or red            Nose- + mild turbinate edema, no-Septal dev, mucus, polyps, erosion, perforation             Throat- Mallampati II-IV , mucosa clear , drainage- none, tonsils- atrophic Neck- flexible , trachea midline, no stridor , thyroid nl, carotid no bruit Chest - symmetrical excursion , unlabored           Heart/CV- RRR , no murmur , no gallop  , no rub, nl s1 s2                           - JVD- none ,  edema- none, stasis changes- none, varices- none           Lung- +no rhonchi or wheeze, wheeze- none, cough+hacking , dullness-none, rub- none           Chest wall-  Abd-  Br/ Gen/ Rectal- Not done, not indicated Extrem- cyanosis- none, clubbing, none, atrophy- none, strength- nl Neuro- grossly intact to observation  Assessment & Plan:

## 2020-08-07 ENCOUNTER — Other Ambulatory Visit: Payer: Self-pay

## 2020-08-07 ENCOUNTER — Encounter: Payer: Self-pay | Admitting: Internal Medicine

## 2020-08-07 ENCOUNTER — Ambulatory Visit (INDEPENDENT_AMBULATORY_CARE_PROVIDER_SITE_OTHER): Payer: Medicare PPO | Admitting: Internal Medicine

## 2020-08-07 DIAGNOSIS — G4733 Obstructive sleep apnea (adult) (pediatric): Secondary | ICD-10-CM | POA: Diagnosis not present

## 2020-08-07 DIAGNOSIS — J060 Acute laryngopharyngitis: Secondary | ICD-10-CM | POA: Diagnosis not present

## 2020-08-07 MED ORDER — AZITHROMYCIN 250 MG PO TABS: ORAL_TABLET | ORAL | 0 refills | Status: DC

## 2020-08-07 NOTE — Patient Instructions (Signed)
Script sent for ZPak.  Fluids, rest and avoid spreading this around.  Hope you feel better soon.  We can continue the CPAP set at auto 10-20  Please call if we can help

## 2020-08-08 DIAGNOSIS — J069 Acute upper respiratory infection, unspecified: Secondary | ICD-10-CM | POA: Insufficient documentation

## 2020-08-08 NOTE — Assessment & Plan Note (Signed)
Nonspecific, favoring viral. Developing ear pain, may be early otitis vs eustacian dysfunction Plan Supportive, Zpak to hold

## 2020-08-08 NOTE — Assessment & Plan Note (Signed)
Note successful in maintaining weight loss. Consider [facility]

## 2020-08-08 NOTE — Assessment & Plan Note (Signed)
Benefits from CPAP with good compliance and control Plan- continue auto 10-20 

## 2021-08-05 ENCOUNTER — Ambulatory Visit: Payer: Medicare PPO | Admitting: Internal Medicine

## 2021-08-07 ENCOUNTER — Ambulatory Visit: Payer: Medicare PPO | Admitting: Internal Medicine

## 2021-09-13 ENCOUNTER — Telehealth: Payer: Self-pay | Admitting: Internal Medicine

## 2021-09-16 NOTE — Progress Notes (Signed)
Subjective:    Patient ID: Harold Vargas, male    DOB: [birth year], 76 y.o.   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  Congestion, cold, glands swollen, ear pain right ear.  Went to conference recently.  Denies fever.  Has chills and body aches.  Negative covid test yesterday. No problems with CPAP. 2 machines, 1 at [facility], so download reflects shifting between these. Reports comfortably using CPAP every night. M.ostly buys his supplies and machines from a discount source in [place] Today incidental acute illness as above.  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 Gets supplies on-line Bedtime approx 8PM to 5AM. No sleep med. Tea in Am. Now on metoprolol for palpitations.  ROS-see HPI  + = positive Constitutional:   No-   weight loss, night sweats, fevers, chills,+ fatigue, no-lassitude. HEENT:   No-  headaches, difficulty swallowing, tooth/dental problems, sore throat,       No-  sneezing, itching, ear ache, nasal congestion, post nasal drip,  CV:  No-   chest pain,  orthopnea, PND, swelling in lower extremities, anasarca,  dizziness, palpitations Resp: No-   shortness of breath with exertion or at rest.              No-   productive cough,  No non-productive cough,  No- coughing up of blood.              No-   change in color of mucus.  No- wheezing.   Skin: No-   rash or lesions. GI:  No-   heartburn, indigestion, abdominal pain, nausea, vomiting,  GU:  MS:  No-   joint pain or swelling.  . Neuro-     nothing unusual Psych:  No- change in mood or affect. No depression or anxiety.  No memory loss.  OBJ- Physical Exam General- Alert, Oriented, Affect-appropriate, Distress- none acute, +obese Skin- rash-none, lesions- none, excoriation- none Lymphadenopathy- none Head- atraumatic            Eyes- Gross vision intact, PERRLA, conjunctivae and secretions clear            Ears- +R TM opacified, not bulging or red            Nose- + mild turbinate edema, no-Septal dev, mucus, polyps, erosion, perforation             Throat- Mallampati II-IV , mucosa clear , drainage- none, tonsils- atrophic Neck- flexible , trachea midline, no stridor , thyroid nl, carotid no bruit Chest - symmetrical excursion , unlabored           Heart/CV- RRR , no murmur , no gallop  , no rub, nl s1 s2                           -  JVD- none , edema- none, stasis changes- none, varices- none           Lung- +no rhonchi or wheeze, wheeze- none, cough-none , dullness-none, rub- none           Chest wall-  Abd-  Br/ Gen/ Rectal- Not done, not indicated Extrem- cyanosis- none, clubbing, none, atrophy- none, strength- nl Neuro- grossly intact to observation  Assessment & Plan:

## 2021-09-16 NOTE — Telephone Encounter (Signed)
Hey can you check to see if you received this fax on your machine?  Thank you

## 2021-09-17 ENCOUNTER — Encounter: Payer: Self-pay | Admitting: Internal Medicine

## 2021-09-17 ENCOUNTER — Ambulatory Visit: Payer: Medicare PPO | Admitting: Internal Medicine

## 2021-09-17 DIAGNOSIS — G4733 Obstructive sleep apnea (adult) (pediatric): Secondary | ICD-10-CM

## 2021-09-17 NOTE — Telephone Encounter (Signed)
Called and spoke to [facility] and ask them to refax recert for cpap machine for this patient. I told them we were having issues with our fax machines for a few days. They state they will refax order.  Jeanice Lim will you keep an eye out for fax for this patient and give to CY to sign.

## 2021-09-17 NOTE — Telephone Encounter (Signed)
I was not here yesterday- but I do not see anything today. Can you confirm if you received it?

## 2021-09-17 NOTE — Patient Instructions (Signed)
We can continue CPAP auto 10-20  Please call if we can help 

## 2021-10-20 NOTE — Assessment & Plan Note (Signed)
Benefits from CPAP using 2 machines Plan- continue auto 10-20

## 2021-10-20 NOTE — Assessment & Plan Note (Signed)
Support efforts to keep weight down.

## 2022-09-21 NOTE — Progress Notes (Signed)
Subjective:    Patient ID: Harold Vargas, male    DOB: [birth year], 77 y.o.   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 Gets supplies on-line Bedtime approx 8PM to 5AM. No sleep med. Tea in Am. Now on metoprolol for palpitations.  09/23/22-77 year old male former Apellate Harold Vargas, now working as an Pensions consultant,  followed for OSA, hypersomnia, complicated by allergic rhinitis, HTN, Obesity, Hyperlipidemia,  CPAP auto 10-20/[facility]   Replaced 10/2018    AirSense 10 AutoSet Has regular and Travel machines. Gets supplies on-line Download-compliance   No  DL or SD card available   Body weight today 259 lbs He reports using CPAP every night and denies having any problems.  Sometimes feels a little stuffy nose in the morning for which he takes Mucinex.  ROS-see HPI  + = positive Constitutional:   No-   weight loss, night sweats, fevers, chills,+ fatigue, no-lassitude. HEENT:   No-  headaches, difficulty swallowing, tooth/dental problems, sore throat,       No-  sneezing, itching, ear ache, nasal congestion, post nasal drip,  CV:  No-   chest pain, orthopnea, PND, swelling in lower extremities, anasarca,  dizziness, palpitations Resp: No-   shortness of breath with exertion or at rest.              No-   productive cough,  No non-productive cough,  No- coughing up of blood.               No-   change in color of mucus.  No- wheezing.   Skin: No-   rash or lesions. GI:  No-   heartburn, indigestion, abdominal pain, nausea, vomiting,  GU:  MS:  No-   joint pain or swelling.  . Neuro-     nothing unusual Psych:  No- change in mood or affect. No depression or anxiety.  No memory loss.  OBJ- Physical Exam General- Alert, Oriented, Affect-appropriate, Distress- none acute, +obese Skin- rash-none, lesions- none, excoriation- none Lymphadenopathy- none Head- atraumatic            Eyes- Gross vision intact, PERRLA, conjunctivae and secretions clear            Ears- +R TM opacified, not bulging or red            Nose- + mild turbinate edema, no-Septal dev, mucus, polyps, erosion, perforation             Throat- Mallampati II-IV , mucosa clear , drainage- none, tonsils- atrophic Neck- flexible , trachea midline, no stridor , thyroid nl, carotid no bruit Chest - symmetrical excursion , unlabored           Heart/CV- RRR , no murmur , no gallop  , no rub, nl s1 s2                           - JVD- none , edema- none, stasis changes- none, varices-  none           Lung- +no rhonchi or wheeze, wheeze- none, cough-none , dullness-none, rub- none           Chest wall-  Abd-  Br/ Gen/ Rectal- Not done, not indicated Extrem- cyanosis- none, clubbing, none, atrophy- none, strength- nl Neuro- grossly intact to observation  Assessment & Plan:

## 2022-09-23 ENCOUNTER — Encounter: Payer: Self-pay | Admitting: Internal Medicine

## 2022-09-23 ENCOUNTER — Ambulatory Visit: Payer: Medicare PPO | Admitting: Internal Medicine

## 2022-09-23 VITALS — BP 110/68 | HR 63 | Ht 71.0 in | Wt 259.6 lb

## 2022-09-23 DIAGNOSIS — G4733 Obstructive sleep apnea (adult) (pediatric): Secondary | ICD-10-CM | POA: Diagnosis not present

## 2022-09-23 NOTE — Patient Instructions (Signed)
We can continue CPAP auto 10-20  Glad you are doing well- please call if we can help

## 2022-09-24 ENCOUNTER — Ambulatory Visit (INDEPENDENT_AMBULATORY_CARE_PROVIDER_SITE_OTHER): Payer: Medicare PPO

## 2022-09-24 ENCOUNTER — Other Ambulatory Visit: Payer: Medicare PPO

## 2022-09-24 ENCOUNTER — Ambulatory Visit: Payer: Medicare PPO | Admitting: Podiatry

## 2022-09-24 ENCOUNTER — Encounter: Payer: Self-pay | Admitting: Podiatry

## 2022-09-24 DIAGNOSIS — M7671 Peroneal tendinitis, right leg: Secondary | ICD-10-CM

## 2022-09-24 DIAGNOSIS — M7662 Achilles tendinitis, left leg: Secondary | ICD-10-CM | POA: Diagnosis not present

## 2022-09-24 MED ORDER — TRIAMCINOLONE ACETONIDE 10 MG/ML IJ SUSP
10.0000 mg | Freq: Once | INTRAMUSCULAR | Status: AC
  Administered 2022-09-24: 10 mg via INTRA_ARTICULAR

## 2022-09-25 NOTE — Progress Notes (Signed)
  Patient was seen, measured for custom molded foot orthotics  Patient will benefit from CFO's as they will help provide total contact to MLA's helping to better distribute body weight across BIL feet greater reducing plantar pressure and pain and to also encourage FF and RF alignment Left heel lift 1/4" will be added for LLD to help restore symmetry and balance  Patient was scanned items to be ordered and fit when in  [employer], CFo, CFm

## 2022-09-26 NOTE — Progress Notes (Signed)
Subjective:   Patient ID: Harold Vargas, male   DOB: 77 y.o.   MRN: [id number]   HPI Patient presents stating that he has had history of Achilles tendinitis and he has pain in the outside of the right foot that he tries to do Rolfing for.  States that remains sore and has been sore for a number of months making it hard to walk and be active and he does like to be very active and walk up to 5 miles a day.  Does not smoke   Review of Systems  All other systems reviewed and are negative.       Objective:  Physical Exam Vitals and nursing note reviewed.  Constitutional:      Appearance: He is well-developed.  Pulmonary:     Effort: Pulmonary effort is normal.  Musculoskeletal:        General: Normal range of motion.  Skin:    General: Skin is warm.  Neurological:     Mental Status: He is alert.     Neurovascular status intact muscle strength was found to be adequate range of motion adequate inflammation of the lateral side right foot base of fifth metatarsal fluid buildup and painful when pressed with no loss of muscle function with mild Achilles tendinitis left but improved with moderate depression of the arch     Assessment:  Peroneal tendinitis right with inflammation along with history of Achilles tendinitis left     Plan:  H&P reviewed I did discuss consideration of injection therapy due to the longstanding nature of problem patient would like to do this and I explained procedure risk and he wants to proceed.  I did sterile prep and I injected carefully the sheath of the tendon 3 mg dexamethasone Kenalog 5 mg Xylocaine and I went ahead and casted him for functional orthotics today with pedorthist doing the casting for [place]  X-rays indicate that there is inflammation but with moderate arthritis but no indications of fracture or acute conditions

## 2022-10-07 ENCOUNTER — Telehealth: Payer: Self-pay | Admitting: Podiatry

## 2022-10-07 NOTE — Telephone Encounter (Signed)
 Lmom for pt to call back to schedule picking up orthotics    Balance is 490.00

## 2022-10-08 ENCOUNTER — Encounter: Payer: Self-pay | Admitting: Internal Medicine

## 2022-10-08 NOTE — Assessment & Plan Note (Signed)
Weight loss with diet/exercise encouraged.

## 2022-10-08 NOTE — Assessment & Plan Note (Signed)
Benefits from CPAP with good compliance and control reported.  He has 2 machines-counting also his travel machine. Plan-continue auto 10-20

## 2023-02-13 DIAGNOSIS — R972 Elevated prostate specific antigen [PSA]: Secondary | ICD-10-CM

## 2023-02-13 HISTORY — DX: Elevated prostate specific antigen (PSA): R97.20

## 2023-02-23 DIAGNOSIS — C61 Malignant neoplasm of prostate: Secondary | ICD-10-CM

## 2023-02-23 HISTORY — DX: Malignant neoplasm of prostate: C61

## 2023-02-27 ENCOUNTER — Encounter: Payer: Self-pay | Admitting: Urology

## 2023-02-27 ENCOUNTER — Other Ambulatory Visit: Payer: Self-pay | Admitting: Urology

## 2023-02-27 DIAGNOSIS — C61 Malignant neoplasm of prostate: Secondary | ICD-10-CM

## 2023-03-05 ENCOUNTER — Other Ambulatory Visit (HOSPITAL_COMMUNITY): Payer: Self-pay | Admitting: Urology

## 2023-03-05 DIAGNOSIS — C61 Malignant neoplasm of prostate: Secondary | ICD-10-CM

## 2023-03-06 ENCOUNTER — Ambulatory Visit
Admission: RE | Admit: 2023-03-06 | Discharge: 2023-03-06 | Disposition: A | Discharge status: Home / Self Care | Payer: Medicare PPO | Source: Ambulatory Visit | Attending: Urology | Admitting: Urology

## 2023-03-06 DIAGNOSIS — C61 Malignant neoplasm of prostate: Secondary | ICD-10-CM

## 2023-03-06 MED ORDER — IOPAMIDOL (ISOVUE-300) INJECTION 61%
100.0000 mL | Freq: Once | INTRAVENOUS | Status: AC | PRN
  Administered 2023-03-06: 100 mL via INTRAVENOUS

## 2023-03-12 ENCOUNTER — Ambulatory Visit
Admission: RE | Admit: 2023-03-12 | Discharge: 2023-03-12 | Disposition: A | Discharge status: Home / Self Care | Payer: Medicare PPO | Source: Ambulatory Visit | Attending: Urology | Admitting: Urology

## 2023-03-12 DIAGNOSIS — C61 Malignant neoplasm of prostate: Secondary | ICD-10-CM

## 2023-03-12 MED ORDER — GADOPICLENOL 0.5 MMOL/ML IV SOLN
10.0000 mL | Freq: Once | INTRAVENOUS | Status: AC | PRN
  Administered 2023-03-12: 10 mL via INTRAVENOUS

## 2023-03-13 ENCOUNTER — Encounter (HOSPITAL_COMMUNITY)
Admission: RE | Admit: 2023-03-13 | Discharge: 2023-03-13 | Disposition: A | Discharge status: Home / Self Care | Payer: Medicare PPO | Source: Ambulatory Visit | Attending: Urology

## 2023-03-13 ENCOUNTER — Encounter (HOSPITAL_COMMUNITY)
Admission: RE | Admit: 2023-03-13 | Discharge: 2023-03-13 | Disposition: A | Discharge status: Home / Self Care | Payer: Medicare PPO | Source: Ambulatory Visit | Attending: Urology | Admitting: Urology

## 2023-03-13 DIAGNOSIS — C61 Malignant neoplasm of prostate: Secondary | ICD-10-CM | POA: Insufficient documentation

## 2023-03-13 MED ORDER — TECHNETIUM TC 99M MEDRONATE IV KIT
21.8000 | PACK | Freq: Once | INTRAVENOUS | Status: AC
  Administered 2023-03-13: 21.8 via INTRAVENOUS

## 2023-03-13 MED ORDER — TECHNETIUM TC 99M MEDRONATE IV KIT: 20.0000 | PACK | Freq: Once | INTRAVENOUS | Status: DC

## 2023-04-01 ENCOUNTER — Encounter: Payer: Self-pay | Admitting: Radiation Oncology

## 2023-04-01 NOTE — Progress Notes (Signed)
GU Location of Tumor / Histology: Prostate Ca  If Prostate Cancer, Gleason Score is (4 + 3) and PSA is (7.21 on 01/12/2023)  Garlan Fillers presented as referral from Dr. Vilma Prader [facility]) elevated PSA.  Biopsies     03/13/2023 Dr. Vilma Prader NM Bone Scan Whole Body CLINICAL DATA: Prostate cancer. PSA 7.21   IMPRESSION: No definite scintigraphic evidence of osseous metastatic disease.   03/11/2022 Dr. Vilma Prader MR Prostate with/without Contrast CLINICAL DATA:  Prostate cancer, C61. Elevated PSA level. R97.20.  Biopsy 02/13/2023 revealed Gleason 4+3=7 involvement of the right base, right lateral base, right lateral mid gland; Gleason 3+4=7 involvement in the right mid gland.  IMPRESSION: 1. Small PI-RADS category 4 lesion of the left posteromedial peripheral zone at the apex. 2. Small PI-RADS category 3 lesions of the right posteromedial and anterior peripheral zone in the mid gland and base. 3.  Targeting data sent to [facility]. 4. Subtle accentuated T1 signal in the central zone and posteromedial peripheral zones at the base, possibly reflecting post biopsy blood products. 5. Sigmoid colon diverticulosis.   03/06/2023 Dr. Vilma Prader CT Abdomen Pelvis with/without Contrast CLINICAL DATA: Prostate cancer, C61. Elevated PSA level. R97.20   IMPRESSION: 1. No findings of metastatic prostate cancer in the abdomen or pelvis. 2. Prostatomegaly. 3. Descending and sigmoid colon diverticulosis. 4. Moderate degenerative hip arthropathy bilaterally. 5. Spondylosis and degenerative disc disease at L4-5 and L5-S1. 6. Mitral valve calcification. 7. Mild aortoiliac atheromatous vascular calcification. 8. Suspected 6 mm hypodense lesion in segment 2 of the liver, technically too small to characterize although statistically likely to be benign lesion such as cyst or hemangioma. In the absence of a history of gastrointestinal malignancy  or abnormal liver enzymes, this is not felt to require further workup. 9. Aortic atherosclerosis. Aortic Atherosclerosis (ICD10-I70.0).    Past/Anticipated interventions by urology, if any: NA  Past/Anticipated interventions by medical oncology, if any:  NA  Weight changes, if any: {:18581}  IPSS: SHIM:  Bowel/Bladder complaints, if any: {:18581}   Nausea/Vomiting, if any: {:18581}  Pain issues, if any:  {:18581}  SAFETY ISSUES: Prior radiation? {:18581} Pacemaker/ICD? {:18581} Possible current pregnancy? Male Is the patient on methotrexate? No  Current Complaints / other details:

## 2023-04-02 NOTE — Progress Notes (Signed)
 Radiation Oncology         [phone]) [phone] ________________________________  Initial Outpatient Consultation  Name: Harold Vargas MRN: [id number]  Date: 04/03/2023  DOB: 02/18/1945  WU:[id number], Windy Fast, MD  Jennette Bill Scherrie Merritts, MD   REFERRING PHYSICIAN: Adonis Brook, MD  DIAGNOSIS: 78 y.o. gentleman with Stage T2a adenocarcinoma of the prostate with Gleason score of 4+3, and PSA of 7.21.    ICD-10-CM   1. 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 He is an established urology patient, previously seen in 03/2015 with Dr. Retta Diones for marginally elevated PSA at 3.14.  Digital rectal exam was normal at that time and a repeat PSA in 10/2015 had decreased back into the normal range at 2.94 so prostate biopsy was not felt necessary at that time.  He has continued follow-up and monitoring of the PSA with his PCP since that time.  More recently, he was noted to have an elevated PSA of 6.78 by his primary care physician, Dr. Su Hilt.  Accordingly, he was referred back to urology for evaluation and met with Dr. Jennette Bill on 01/09/23,  digital rectal examination performed at that time showed a firm left lateral prostate nodule. A repeat PSA obtained that day showed further elevation to 7.21.  Therefore, the patient proceeded to transrectal ultrasound with 12 biopsies of the prostate on 02/13/23.  The prostate volume measured 64 cc.  Out of 12 core biopsies, 4 were positive.  The maximum Gleason score was 4+3, and this was seen in the right base lateral (with perineural invasion), right mid lateral, and right base. Additionally, a small focus of Gleason 3+4 was seen in the right mid.  He underwent staging CT A/P on 03/06/23 showing no findings of metastatic prostate cancer.  There was an incidental finding of a 6 mm liver lesion that is felt most likely to be a benign  hemangioma or cyst.  He also had a prostate MRI on 03/12/23 showing a small PI-RADS 4 lesion in the left posteromedial peripheral zone at the apex as well as two small PI-RADS 3 lesions in the right posteromedial and anterior peripheral zone in the mid gland and base. Finally, he also had a bone scan to complete his disease staging on 03/13/23 and this was without any definite evidence of osseous metastatic disease.  The patient reviewed the biopsy and imaging results with his urologist and he has kindly been referred today for discussion of potential radiation treatment options.  He has also met with Dr. Eldridge Abrahams at [facility] on 03/19/2023 who recommended radiation treatment and has him set up to meet with Dr. Nedra Hai on 04/07/2023 for consult.  PREVIOUS RADIATION THERAPY: No  PAST MEDICAL HISTORY:  Past Medical History:  Diagnosis Date   Arthritis 2017   ED (erectile dysfunction) 2017   Elevated PSA 02/13/2023   01/09/2023, 2017   GERD (gastroesophageal reflux disease)    Hypercholesterolemia 2017   Hypertension    OSA (obstructive sleep apnea) 2017   Prostate cancer (HCC) 02/23/2023      PAST SURGICAL HISTORY: Past Surgical History:  Procedure Laterality Date   PROSTATE BIOPSY     TONSILLECTOMY      FAMILY HISTORY:  Family History  Problem Relation Age of Onset   Stroke Father     SOCIAL HISTORY: Retired Education administrator but still Therapist, art  locally Social History   Socioeconomic History   Marital status: Married    Spouse name: Not on file   Number of children: Not on file   Years of education: Not on file   Highest education level: Not on file  Occupational History   Occupation: Pensions consultant and newly elected [name]  Tobacco Use   Smoking status: Never   Smokeless tobacco: Never  Substance and Sexual Activity   Alcohol use: Yes    Comment: occasional   Drug use: Not on file   Sexual activity: Not on file  Other Topics Concern   Not on file  Social History Narrative    Not on file   Social Drivers of Health   Financial Resource Strain: Not on file  Food Insecurity: Not on file  Transportation Needs: Not on file  Physical Activity: Not on file  Stress: Not on file  Social Connections: Not on file  Intimate Partner Violence: Not on file    ALLERGIES: Patient has no known allergies.  MEDICATIONS:  Current Outpatient Medications  Medication Sig Dispense Refill   losartan (COZAAR) 25 MG tablet Take 25 mg by mouth daily.     metoprolol succinate (TOPROL-XL) 25 MG 24 hr tablet Take 25 mg by mouth daily. Takes 12.5 MG (half a tablet) daily     rosuvastatin (CRESTOR) 10 MG tablet Take 10 mg by mouth daily.       No current facility-administered medications for this encounter.    REVIEW OF SYSTEMS:  On review of systems, the patient reports that he is doing well overall. He denies any chest pain, shortness of breath, cough, fevers, chills, night sweats, unintended weight changes. He denies any bowel disturbances, and denies abdominal pain, nausea or vomiting. He denies any new musculoskeletal or joint aches or pains. His IPSS was 16, indicating moderate urinary symptoms which he reports is worsened since the time of his biopsy.  Prior to biopsy, he was more in the 8 range to his estimation. His SHIM was 5, indicating he has severe erectile dysfunction. A complete review of systems is obtained and is otherwise negative.    PHYSICAL EXAM:  Wt Readings from Last 3 Encounters:  09/23/22 259 lb 9.6 oz (117.8 kg)  09/17/21 258 lb 12.8 oz (117.4 kg)  08/07/20 254 lb 3.2 oz (115.3 kg)   Temp Readings from Last 3 Encounters:  08/07/20 98.3 F (36.8 C) (Oral)  12/04/11 98.4 F (36.9 C) (Oral)   BP Readings from Last 3 Encounters:  09/23/22 110/68  09/17/21 100/62  08/07/20 108/78   Pulse Readings from Last 3 Encounters:  09/23/22 63  09/17/21 78  08/07/20 68    /10  In general this is a well appearing Caucasian male in no acute distress. He's alert  and oriented x4 and appropriate throughout the examination. Cardiopulmonary assessment is negative for acute distress, and he exhibits normal effort.     KPS = 100  100 - Normal; no complaints; no evidence of disease. 90   - Able to carry on normal activity; minor signs or symptoms of disease. 80   - Normal activity with effort; some signs or symptoms of disease. 69   - Cares for self; unable to carry on normal activity or to do active work. 60   - Requires occasional assistance, but is able to care for most of his personal needs. 50   - Requires considerable assistance and frequent medical care. 40   - Disabled; requires special care and  assistance. 30   - Severely disabled; hospital admission is indicated although death not imminent. 20   - Very sick; hospital admission necessary; active supportive treatment necessary. 10   - Moribund; fatal processes progressing rapidly. 0     - Dead  Karnofsky DA, Abelmann WH, Craver LS and Burchenal JH [phone]) The use of the nitrogen mustards in the palliative treatment of carcinoma: with particular reference to bronchogenic carcinoma Cancer 1 [id number]  LABORATORY DATA:  No results found for: "WBC", "HGB", "HCT", "MCV", "PLT" No results found for: "NA", "K", "CL", "CO2" No results found for: "ALT", "AST", "GGT", "ALKPHOS", "BILITOT"   RADIOGRAPHY: NM Bone Scan Whole Body Result Date: 03/17/2023 CLINICAL DATA:  Prostate cancer.  PSA 7.21 EXAM: NUCLEAR MEDICINE WHOLE BODY BONE SCAN TECHNIQUE: Whole body anterior and posterior images were obtained approximately 3 hours after intravenous injection of radiopharmaceutical. RADIOPHARMACEUTICALS:  21.8 mCi Technetium-102m MDP IV COMPARISON:  CT abdomen pelvis 03/06/2023.  Prostate MRI 03/12/2023. FINDINGS: Physiologic distribution radiotracer along the kidneys and bladder. Degenerative type areas of uptake along the knees, shoulders. Slight curvature of the spine. Speckled areas of uptake along the right hemipelvis  seen on the anterior projection. This could be contamination. IMPRESSION: No definite scintigraphic evidence of osseous metastatic disease. Electronically Signed   By: Karen Kays M.D.   On: 03/17/2023 16:32   CT ABDOMEN PELVIS W CONTRAST Result Date: 03/13/2023 CLINICAL DATA:  Prostate cancer, C61.  Elevated PSA level.  R97.20 EXAM: CT ABDOMEN AND PELVIS WITH CONTRAST TECHNIQUE: Multidetector CT imaging of the abdomen and pelvis was performed using the standard protocol following bolus administration of intravenous contrast. RADIATION DOSE REDUCTION: This exam was performed according to the departmental dose-optimization program which includes automated exposure control, adjustment of the mA and/or kV according to patient size and/or use of iterative reconstruction technique. CONTRAST:  ISOVUE-300 IOPAMIDOL (ISOVUE-300) INJECTION 61% COMPARISON:  None Available. FINDINGS: Lower chest: Mitral valve calcification. Hepatobiliary: Suspected 6 mm hypodense lesion in segment 2 of the liver on image 19 series 2, technically too small to characterize although statistically likely to be benign lesion such as cyst or hemangioma. In the absence of a history of gastrointestinal malignancy or abnormal liver enzymes, this is not felt to require further workup. Gallbladder unremarkable. Pancreas: Unremarkable Spleen: Unremarkable Adrenals/Urinary Tract: No significant findings. Stomach/Bowel: Descending and sigmoid colon diverticulosis. There are a few scattered and clustered jejunal diverticula. No findings of diverticular inflammation. Vascular/Lymphatic: Mild aortoiliac atheromatous vascular calcification. No pathologic adenopathy observed. Reproductive: Prostatomegaly noted. Other: No supplemental non-categorized findings. Musculoskeletal: Moderate degenerative hip arthropathy bilaterally. Spondylosis and degenerative disc disease at L4-5 and L5-S1. IMPRESSION: 1. No findings of metastatic prostate cancer in the  abdomen or pelvis. 2. Prostatomegaly. 3. Descending and sigmoid colon diverticulosis. 4. Moderate degenerative hip arthropathy bilaterally. 5. Spondylosis and degenerative disc disease at L4-5 and L5-S1. 6. Mitral valve calcification. 7. Mild aortoiliac atheromatous vascular calcification. 8. Suspected 6 mm hypodense lesion in segment 2 of the liver, technically too small to characterize although statistically likely to be benign lesion such as cyst or hemangioma. In the absence of a history of gastrointestinal malignancy or abnormal liver enzymes, this is not felt to require further workup. 9. Aortic atherosclerosis. Aortic Atherosclerosis (ICD10-I70.0). Electronically Signed   By: Gaylyn Rong M.D.   On: 03/13/2023 09:07   MR PROSTATE W WO CONTRAST Result Date: 03/13/2023 CLINICAL DATA:  Prostate cancer, C61. Elevated PSA level. R97.20. Biopsy 02/13/2023 revealed Gleason 4+3=7 involvement of the right base, right  lateral base, right lateral mid gland; Gleason 3+4=7 involvement in the right mid gland. EXAM: MR PROSTATE WITHOUT AND WITH CONTRAST TECHNIQUE: Multiplanar multisequence MRI images were obtained of the pelvis centered about the prostate. Pre and post contrast images were obtained. CONTRAST:  10 cc Vueway COMPARISON:  CT pelvis 03/06/2023 FINDINGS: Prostate: Subtle accentuated T1 signal in the central zone and posteromedial peripheral zones at the base noted, for example on image 25 of series 4 -2, possibly reflecting post biopsy blood products. Background hazy low T2 signal in the peripheral zone yielding surprisingly low conspicuity of known right basilar and right mid gland prostate tumor. Region of interest # one: PI-RADS category 3 lesion of the right posteromedial peripheral zone in the mid gland and base with reduced ADC map activity is shown on image 11 series 8. Region of interest # 2: Small PI-RADS category 3 lesion of the right anterior peripheral zone in the mid gland and base with  focally reduced T2 signal (image 37 series 6) corresponding to minimal reduced ADC map activity (image 11 series 8). This measures 0.14 cc (0.8 by 0.3 by 0.5 cm). Region of interest # 3: Small PI-RADS category 4 lesion of the left posteromedial peripheral zone at the apex with focally reduced T2 signal (image 58 series 6) obscured by motion artifact on the ADC map images but demonstrating mild focal early enhancement (image 155 series 12). This measures 0.18 cc (0.9 by 0.4 by 0.6 cm). Volume: 3D volumetric analysis: Prostate volume 64.0 cc (5.8 by 4.6 by 5.1 cm). Transcapsular spread: Absent Seminal vesicle involvement: Absent Neurovascular bundle involvement: Absent Pelvic adenopathy: Absent Bone metastasis: Absent Other findings: Sigmoid colon diverticulosis. IMPRESSION: 1. Small PI-RADS category 4 lesion of the left posteromedial peripheral zone at the apex. 2. Small PI-RADS category 3 lesions of the right posteromedial and anterior peripheral zone in the mid gland and base. 3.  Targeting data sent to UroNAV. 4. Subtle accentuated T1 signal in the central zone and posteromedial peripheral zones at the base, possibly reflecting post biopsy blood products. 5. Sigmoid colon diverticulosis. Electronically Signed   By: Gaylyn Rong M.D.   On: 03/13/2023 08:52      IMPRESSION/PLAN: 1. 78 y.o. gentleman with Stage T2a adenocarcinoma of the prostate with Gleason Score of 4+3, and PSA of 7.21. We discussed the patient's workup and outlined the nature of prostate cancer in this setting. The patient's T stage, Gleason's score, and PSA put him into the unfavorable intermediate risk group. Accordingly, he is eligible for a variety of potential treatment options including prostatectomy, brachytherapy, or ST-ADT concurrent with a 5.5 week course of daily external beam radiation. We discussed the available radiation techniques, and focused on the details and logistics of delivery. The patient may not be an ideal  candidate for brachytherapy with moderate LUTS and a prostate volume of 64 cc prior to downsizing from hormone therapy. We discussed that based on his prostate volume, he would require beginning treatment with a 5 alpha reductase inhibitor +/- ADT for at least 2-3 months to allow for downsizing of the prostate prior to initiating brachytherapy. We discussed and outlined the risks, benefits, short and long-term effects associated with radiotherapy and compared and contrasted these with prostatectomy. We discussed the role of SpaceOAR gel in reducing the rectal toxicity associated with radiotherapy. We also detailed the role of ADT in the treatment of unfavorable intermediate risk prostate cancer and outlined the associated side effects that could be expected with this therapy.  Given  his cardiac history, we do not strongly recommend hormone therapy concurrent with external beam radiation. Although not completely contraindicated, we feel that the risks likely outweigh any small potential benefit in disease-free interval, with the understanding that there is no overall survival benefit. If he were to proceed with concurrent ST-ADT/XRT or ADT for downsizing the prostate, he would likely be a good candidate for Orgovyx given it's lower cardiac risk profile. He appears to have a good understanding of his disease and our treatment recommendations which are of curative intent.  He was encouraged to ask questions that were answered to his stated satisfaction.  At the conclusion of our conversation, the patient remains undecided regarding his treatment preference and plans to keep his scheduled consult visit with Dr. Nedra Hai on 04/07/2023 for second opinion.  He may also be interested in learning about stereotactic body radiotherapy (SBRT prostate) which we do not currently offer here in [place]. He intends to reach a decision shortly thereafter and has our contact information so that he can let us know if he ultimately  elects to proceed with either brachytherapy or a 5.5 week course of daily external beam radiation here in [place].  We enjoyed meeting him and his wife today and look forward to continuing to participate in his care.  We personally spent 70 minutes in this encounter including chart review, reviewing radiological studies, meeting face-to-face with the patient, entering orders and completing documentation.    Marguarite Arbour, PA-C    Margaretmary Dys, MD  [facility]  Radiation Oncology Direct Dial: [phone]  Fax: [phone] [email or web address]  Skype  LinkedIn   This document serves as a record of services personally performed by Margaretmary Dys, MD and Marcello Fennel, PA-C. It was created on their behalf by Mickie Bail, a trained medical scribe. The creation of this record is based on the scribe's personal observations and the provider's statements to them. This document has been checked and approved by the attending provider.

## 2023-04-03 ENCOUNTER — Encounter: Payer: Self-pay | Admitting: Radiation Oncology

## 2023-04-03 ENCOUNTER — Ambulatory Visit
Admission: RE | Admit: 2023-04-03 | Discharge: 2023-04-03 | Disposition: A | Discharge status: Home / Self Care | Payer: Medicare PPO | Source: Ambulatory Visit | Attending: Radiation Oncology | Admitting: Radiation Oncology

## 2023-04-03 VITALS — BP 149/83 | HR 84 | Temp 97.7°F | Resp 20 | Ht 71.0 in | Wt 256.4 lb

## 2023-04-03 DIAGNOSIS — K219 Gastro-esophageal reflux disease without esophagitis: Secondary | ICD-10-CM | POA: Diagnosis not present

## 2023-04-03 DIAGNOSIS — C61 Malignant neoplasm of prostate: Secondary | ICD-10-CM | POA: Diagnosis present

## 2023-04-03 DIAGNOSIS — K5730 Diverticulosis of large intestine without perforation or abscess without bleeding: Secondary | ICD-10-CM | POA: Diagnosis not present

## 2023-04-03 DIAGNOSIS — Z79899 Other long term (current) drug therapy: Secondary | ICD-10-CM | POA: Insufficient documentation

## 2023-04-03 DIAGNOSIS — G4733 Obstructive sleep apnea (adult) (pediatric): Secondary | ICD-10-CM | POA: Diagnosis not present

## 2023-04-03 DIAGNOSIS — I700 Atherosclerosis of aorta: Secondary | ICD-10-CM | POA: Insufficient documentation

## 2023-04-03 DIAGNOSIS — M170 Bilateral primary osteoarthritis of knee: Secondary | ICD-10-CM | POA: Diagnosis not present

## 2023-04-03 DIAGNOSIS — M51379 Other intervertebral disc degeneration, lumbosacral region without mention of lumbar back pain or lower extremity pain: Secondary | ICD-10-CM | POA: Diagnosis not present

## 2023-04-03 DIAGNOSIS — E7800 Pure hypercholesterolemia, unspecified: Secondary | ICD-10-CM | POA: Insufficient documentation

## 2023-04-03 DIAGNOSIS — I10 Essential (primary) hypertension: Secondary | ICD-10-CM | POA: Insufficient documentation

## 2023-04-03 HISTORY — DX: Gastro-esophageal reflux disease without esophagitis: K21.9

## 2023-04-03 NOTE — Progress Notes (Signed)
Introduced myself to the patient as the prostate nurse navigator.  No barriers to care identified at this time.  He is here to discuss his radiation treatment options. He will have a second opinion will [facility] on 2/18, I will plan to follow up with him shortly after. I gave him my business card and asked him to call me with questions or concerns.  Verbalized understanding.

## 2023-04-13 NOTE — Progress Notes (Signed)
 Patient was a radiation oncology consult on 2/14 for his stage T2a adenocarcinoma of the prostate with Gleason score of 4+3, and PSA of 7.21.   Patient will proceed with radiation treatment at [facility].

## 2023-09-15 NOTE — Procedures (Signed)
Mask fit

## 2024-02-08 ENCOUNTER — Ambulatory Visit: Admitting: Adult Health

## 2024-02-08 ENCOUNTER — Encounter: Payer: Self-pay | Admitting: Adult Health

## 2024-02-08 VITALS — BP 102/70 | HR 67 | Ht 71.0 in | Wt 250.4 lb

## 2024-02-08 DIAGNOSIS — G4733 Obstructive sleep apnea (adult) (pediatric): Secondary | ICD-10-CM | POA: Diagnosis not present

## 2024-02-08 DIAGNOSIS — Z6834 Body mass index (BMI) 34.0-34.9, adult: Secondary | ICD-10-CM

## 2024-02-08 NOTE — Patient Instructions (Signed)
 Continue on CPAP At bedtime.  Keep up great work Work on healthy weight loss  Do not drive if sleepy  Follow up with Dr. Olena or Hillery Bhalla NP in 1 year and As needed

## 2024-02-08 NOTE — Progress Notes (Signed)
 "  @Patient  ID: Harold Vargas, male    DOB: [birth year], 78 y.o.   MRN: [id number]  Chief Complaint  Patient presents with   Sleep Apnea    No problems with CPAP machine.    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  Last seen August 2024.  Patient is followed for severe obstructive sleep apnea is on nocturnal CPAP.  Patient says he is doing very well on CPAP.  Wears a CPAP every single night.  Cannot sleep without it.  Feels that he benefits from CPAP.  Patient has 2 machines has a travel machine that he uses when he is at the beach.  CPAP download shows excellent compliance with daily average usage at 9 hours.  Patient is on auto CPAP 10 to 20 cm H2O.  AHI 1.6/hour. Uses nasal pillows.  DME is [facility]  Was diagnosed with prostate cancer earlier this year.  Underwent radiation treatment at [facility].  Says he is doing well.   Allergies[1]  Immunization History  Administered Date(s) Administered   Fluad Quad(high Dose 65+) 11/09/2023   INFLUENZA, HIGH DOSE SEASONAL PF 10/09/2018   Influenza Split 11/18/2010, 11/29/2012, 11/17/2013   Influenza Whole 11/17/2009, 11/18/2011   Influenza,inj,Quad PF,6+ Mos 10/08/2016   PFIZER Comirnaty(Gray Top)Covid-19 Tri-Sucrose Vaccine 11/15/2019, 05/28/2020   PFIZER(Purple Top)SARS-COV-2 Vaccination 04/16/2019, 05/01/2019   Zoster Recombinant(Shingrix) 11/11/2017, 01/30/2018    Past Medical History:  Diagnosis Date   Arthritis 2017   ED (erectile dysfunction) 2017   Elevated PSA 02/13/2023   01/09/2023, 2017   GERD (gastroesophageal reflux disease)    Hypercholesterolemia 2017   Hypertension    OSA (obstructive sleep apnea) 2017   Prostate cancer  (HCC) 02/23/2023    Tobacco History: Tobacco Use History[2] Counseling given: Not Answered   Outpatient Medications Prior to Visit  Medication Sig Dispense Refill   losartan (COZAAR) 25 MG tablet Take 25 mg by mouth daily.     metoprolol succinate (TOPROL-XL) 25 MG 24 hr tablet Take 25 mg by mouth daily. Takes 12.5 MG (half a tablet) daily     rosuvastatin (CRESTOR) 10 MG tablet Take 10 mg by mouth daily.       No facility-administered medications prior to visit.     Review of Systems:   Constitutional:   No  weight loss, night sweats,  Fevers, chills, fatigue, or  lassitude.  HEENT:   No headaches,  Difficulty swallowing,  Tooth/dental problems, or  Sore throat,                No sneezing, itching, ear ache, nasal congestion, post nasal drip,   CV:  No chest pain,  Orthopnea, PND, swelling in lower extremities, anasarca, dizziness, palpitations, syncope.   GI  No heartburn, indigestion, abdominal pain, nausea, vomiting, diarrhea, change in bowel habits, loss of appetite, bloody stools.   Resp: No shortness of breath with exertion or at rest.  No excess mucus, no productive cough,  No non-productive cough,  No coughing up of blood.  No change in color of mucus.  No wheezing.  No chest wall deformity  Skin: no rash or lesions.  GU: no dysuria, change in color of urine, no urgency or frequency.  No flank  pain, no hematuria   MS:  No joint pain or swelling.  No decreased range of motion.  No back pain.    Physical Exam  BP 102/70 (BP Location: Left Arm, Patient Position: Sitting)   Pulse 67   Ht 5' 11 (1.803 m)   Wt 250 lb 6.4 oz (113.6 kg)   SpO2 94% Comment: RA  BMI 34.92 kg/m   GEN: A/Ox3; pleasant , NAD, well nourished    HEENT:  [place]/AT,   NOSE-clear, THROAT-clear, no lesions, no postnasal drip or exudate noted.   NECK:  Supple w/ fair ROM; no JVD; normal carotid impulses w/o bruits; no thyromegaly or nodules palpated; no lymphadenopathy.    RESP  Clear  P & A;  w/o, wheezes/ rales/ or rhonchi. no accessory muscle use, no dullness to percussion  CARD:  RRR, no m/r/g, no peripheral edema, pulses intact, no cyanosis or clubbing.  GI:   Soft & nt; nml bowel sounds; no organomegaly or masses detected.   Musco: Warm bil, no deformities or joint swelling noted.   Neuro: alert, no focal deficits noted.    Skin: Warm, no lesions or rashes    Lab Results:Reviewed 02/08/2024   CBC No results found for: WBC, RBC, HGB, HCT, PLT, MCV, MCH, MCHC, RDW, LYMPHSABS, MONOABS, EOSABS, BASOSABS  BMET No results found for: NA, K, CL, CO2, GLUCOSE, BUN, CREATININE, CALCIUM, GFRNONAA, GFRAA  BNP No results found for: BNP  ProBNP No results found for: PROBNP  Imaging: No results found.  Administration History     None           No data to display          No results found for: NITRICOXIDE      No data to display              Assessment & Plan:   Assessment and Plan Severe obstructive sleep apnea with excellent control and compliance on nocturnal CPAP.  Patient has perceived benefit.  Continue current settings.  Order for CPAP supplies sent to medical care.  Patient is advised to avoid using automatic ozone/so-clean machines for CPAP.  CPAP care discussed in detail.  BMI 34.  Patient is encouraged on healthy weightmanagement  Plan  Patient Instructions  Continue on CPAP At bedtime.  Keep up great work Work on healthy weight loss  Do not drive if sleepy  Follow up with Dr. Olena or Ashiya Kinkead NP in 1 year and As needed           Jayanth Szczesniak, NP 02/08/2024      [1] No Known Allergies [2]  Social History Tobacco Use  Smoking Status Never  Smokeless Tobacco Never   "
